# Patient Record
Sex: Female | Born: 1950 | Race: White | Hispanic: No | Marital: Married | Smoking: Never smoker
Health system: Southern US, Community
[De-identification: ages and names within clinical notes are randomized; demographics above are authoritative.]

## PROBLEM LIST (undated history)

## (undated) DIAGNOSIS — G4700 Insomnia, unspecified: Secondary | ICD-10-CM

## (undated) DIAGNOSIS — E0590 Thyrotoxicosis, unspecified without thyrotoxic crisis or storm: Secondary | ICD-10-CM

## (undated) DIAGNOSIS — E7800 Pure hypercholesterolemia, unspecified: Secondary | ICD-10-CM

## (undated) DIAGNOSIS — G43909 Migraine, unspecified, not intractable, without status migrainosus: Secondary | ICD-10-CM

## (undated) DIAGNOSIS — K529 Noninfective gastroenteritis and colitis, unspecified: Secondary | ICD-10-CM

## (undated) DIAGNOSIS — I341 Nonrheumatic mitral (valve) prolapse: Secondary | ICD-10-CM

## (undated) DIAGNOSIS — J45991 Cough variant asthma: Secondary | ICD-10-CM

## (undated) DIAGNOSIS — M1990 Unspecified osteoarthritis, unspecified site: Secondary | ICD-10-CM

## (undated) DIAGNOSIS — Z8489 Family history of other specified conditions: Secondary | ICD-10-CM

## (undated) HISTORY — DX: Insomnia, unspecified: G47.00

## (undated) HISTORY — DX: Noninfective gastroenteritis and colitis, unspecified: K52.9

## (undated) HISTORY — DX: Thyrotoxicosis, unspecified without thyrotoxic crisis or storm: E05.90

## (undated) HISTORY — DX: Nonrheumatic mitral (valve) prolapse: I34.1

## (undated) HISTORY — DX: Cough variant asthma: J45.991

## (undated) HISTORY — DX: Pure hypercholesterolemia, unspecified: E78.00

## (undated) HISTORY — DX: Migraine, unspecified, not intractable, without status migrainosus: G43.909

## (undated) HISTORY — PX: PILONIDAL CYST EXCISION: SHX744

---

## 1997-11-26 ENCOUNTER — Other Ambulatory Visit: Admission: RE | Admit: 1997-11-26 | Discharge: 1997-11-26 | Payer: Self-pay | Admitting: *Deleted

## 1998-06-26 ENCOUNTER — Other Ambulatory Visit: Admission: RE | Admit: 1998-06-26 | Discharge: 1998-06-26 | Payer: Self-pay | Admitting: *Deleted

## 1998-11-27 ENCOUNTER — Other Ambulatory Visit: Admission: RE | Admit: 1998-11-27 | Discharge: 1998-11-27 | Payer: Self-pay | Admitting: *Deleted

## 1999-12-05 ENCOUNTER — Other Ambulatory Visit: Admission: RE | Admit: 1999-12-05 | Discharge: 1999-12-05 | Payer: Self-pay | Admitting: *Deleted

## 2000-05-11 ENCOUNTER — Other Ambulatory Visit: Admission: RE | Admit: 2000-05-11 | Discharge: 2000-05-11 | Payer: Self-pay | Admitting: General Surgery

## 2000-12-06 ENCOUNTER — Other Ambulatory Visit: Admission: RE | Admit: 2000-12-06 | Discharge: 2000-12-06 | Payer: Self-pay | Admitting: Obstetrics & Gynecology

## 2001-01-11 ENCOUNTER — Ambulatory Visit (HOSPITAL_COMMUNITY): Admission: RE | Admit: 2001-01-11 | Discharge: 2001-01-11 | Payer: Self-pay | Admitting: Internal Medicine

## 2001-01-11 ENCOUNTER — Encounter: Payer: Self-pay | Admitting: Internal Medicine

## 2001-11-11 ENCOUNTER — Other Ambulatory Visit: Admission: RE | Admit: 2001-11-11 | Discharge: 2001-11-11 | Payer: Self-pay | Admitting: *Deleted

## 2001-12-16 ENCOUNTER — Ambulatory Visit (HOSPITAL_COMMUNITY): Admission: RE | Admit: 2001-12-16 | Discharge: 2001-12-16 | Payer: Self-pay | Admitting: *Deleted

## 2002-11-16 ENCOUNTER — Other Ambulatory Visit: Admission: RE | Admit: 2002-11-16 | Discharge: 2002-11-16 | Payer: Self-pay | Admitting: *Deleted

## 2003-04-11 ENCOUNTER — Ambulatory Visit (HOSPITAL_COMMUNITY): Admission: RE | Admit: 2003-04-11 | Discharge: 2003-04-11 | Payer: Self-pay | Admitting: Gastroenterology

## 2004-03-12 ENCOUNTER — Ambulatory Visit (HOSPITAL_BASED_OUTPATIENT_CLINIC_OR_DEPARTMENT_OTHER): Admission: RE | Admit: 2004-03-12 | Discharge: 2004-03-12 | Payer: Self-pay | Admitting: Internal Medicine

## 2010-05-28 ENCOUNTER — Other Ambulatory Visit: Payer: Self-pay | Admitting: Internal Medicine

## 2010-05-28 DIAGNOSIS — R7989 Other specified abnormal findings of blood chemistry: Secondary | ICD-10-CM

## 2010-06-04 ENCOUNTER — Ambulatory Visit
Admission: RE | Admit: 2010-06-04 | Discharge: 2010-06-04 | Disposition: A | Discharge status: Home / Self Care | Payer: Managed Care, Other (non HMO) | Source: Ambulatory Visit | Attending: Internal Medicine | Admitting: Internal Medicine

## 2010-06-04 DIAGNOSIS — R7989 Other specified abnormal findings of blood chemistry: Secondary | ICD-10-CM

## 2011-01-08 ENCOUNTER — Encounter: Payer: Self-pay | Admitting: Pulmonary Disease

## 2011-01-13 ENCOUNTER — Encounter: Payer: Self-pay | Admitting: Pulmonary Disease

## 2011-01-14 ENCOUNTER — Encounter: Payer: Self-pay | Admitting: Pulmonary Disease

## 2011-01-14 ENCOUNTER — Ambulatory Visit (INDEPENDENT_AMBULATORY_CARE_PROVIDER_SITE_OTHER): Payer: Managed Care, Other (non HMO) | Admitting: Pulmonary Disease

## 2011-01-14 VITALS — BP 110/70 | HR 74 | Temp 98.0°F | Ht 65.0 in | Wt 134.4 lb

## 2011-01-14 DIAGNOSIS — R05 Cough: Secondary | ICD-10-CM

## 2011-01-14 DIAGNOSIS — R059 Cough, unspecified: Secondary | ICD-10-CM

## 2011-01-14 DIAGNOSIS — R053 Chronic cough: Secondary | ICD-10-CM | POA: Insufficient documentation

## 2011-01-14 NOTE — Patient Instructions (Signed)
Will change pulmicort to qvar  2 inhalations am and pm.  Keep mouth rinsed well. Limit overuse of voice, no throat clearing, use hard candy during day to soothe back of throat to see if helps cough. Please call me in 3 weeks with your response to treatment.  If not better, would consider evaluation with methacholine challenge testing.

## 2011-01-14 NOTE — Progress Notes (Signed)
  Subjective:    Patient ID: Bonnie Dodson, female    DOB: 13-Dec-1950, 60 y.o.   MRN: [id number]  HPI The patient is a 60 year old female who I have been asked to see for persistent cough.  The patient was diagnosed with cough variant asthma approximately 8-9 years ago.  She had noted every time she had a upper respiratory  Infection, she would have a persistent cough afterwards.  She also noticed the cough would worsen with cold air exposure.  She would be treated with a course of prednisone and cough would resolve.  This occurred over multiple occasions, and ultimately she was placed on inhaled corticosteroids.  She felt that she did much better while on this with respect to cold weather exposure and when she developed upper respiratory infections.  She was maintained on Pulmicort one inhalation b.i.d.  In June of this year she returned from a trip to [place], and developed postnasal drip, sore throat, as well as some chest tightness.  She had a worsening dry and hacking cough, and ultimately was treated with a course of prednisone in mid July.  She saw no difference in her cough after the prednisone taper.  She was also treated with a course of antibiotics, but unfortunately during this time developed new upper respiratory tract symptoms.  She has been treated with further doses of prednisone, but continues to have a dry hacking cough.  At its worst, it would increase with prolonged conversation and cold air exposure when opening her refrigerator.  The patient denies postnasal drip or reflux symptoms, and has not been clearing her throat.  She denies a globus sensation.  She has no chronic sinus disease or allergic disease to her knowledge.  She has never had pulmonary  function studies, and has not had a recent chest x-ray.   Review of Systems  Constitutional: Negative for fever and unexpected weight change.  HENT: Negative for ear pain, nosebleeds, congestion, sore throat, rhinorrhea, sneezing,  trouble swallowing, dental problem, postnasal drip and sinus pressure.   Eyes: Negative for redness and itching.  Respiratory: Positive for cough and shortness of breath. Negative for chest tightness and wheezing.   Cardiovascular: Negative for palpitations and leg swelling.  Gastrointestinal: Negative for nausea and vomiting.  Genitourinary: Negative for dysuria.  Musculoskeletal: Negative for joint swelling.  Skin: Negative for rash.  Neurological: Negative for headaches.  Hematological: Does not bruise/bleed easily.  Psychiatric/Behavioral: Negative for dysphoric mood. The patient is not nervous/anxious.        Objective:   Physical Exam Constitutional:  Well developed, no acute distress  HENT:  Nares patent without discharge  Oropharynx without exudate, palate and uvula are normal  Eyes:  Perrla, eomi, no scleral icterus  Neck:  No JVD, no TMG  Cardiovascular:  Normal rate, regular rhythm, no rubs or gallops.  No murmurs        Intact distal pulses  Pulmonary :  Normal breath sounds, no stridor or respiratory distress   No rales, rhonchi, or wheezing  Abdominal:  Soft, nondistended, bowel sounds present.  No tenderness noted.   Musculoskeletal:  No lower extremity edema noted.  Lymph Nodes:  No cervical lymphadenopathy noted  Skin:  No cyanosis noted  Neurologic:  Alert, appropriate, moves all 4 extremities without obvious deficit.         Assessment & Plan:

## 2011-01-14 NOTE — Assessment & Plan Note (Signed)
The patient has a persistent cough despite ongoing treatment for presumed cough variant asthma, as well as prednisone tapers.  Failure to respond to oral prednisone raises the question whether her current cough is really related to asthma.  Some of her history is suggestive of an upper airway cyclical cough.  Her spirometry today is normal.  At this point, I would like to change her inhaler  to Qvar, which has a smaller particles size and less irritating to the upper airway.  Would also like her to try the various behavioral therapies which can help upper airway cyclical coughing.  She denies any postnasal drip or reflux disease currently.  The other question that I have is whether she really has cough variant asthma.  Her response to Pulmicort in the past suggests that she may, although she could also have an upper airway issue which mimics this.  The other possibility is reactive airways disease (not asthma) associated with cold air or other irritants such as upper respiratory tract infections.  I have discussed with her the possibility of doing a methacholine challenge test.  If she continues to have her current issues, I would recommend doing this.

## 2011-02-04 ENCOUNTER — Telehealth: Payer: Self-pay | Admitting: Pulmonary Disease

## 2011-02-04 NOTE — Telephone Encounter (Signed)
[email or web address]

## 2011-02-04 NOTE — Telephone Encounter (Signed)
Pt returned Triage's call & can be reached at [phone].  Bonnie Dodson

## 2011-02-04 NOTE — Telephone Encounter (Signed)
At OV on 01/14/11, KC changed pulmicort to qvar 80 2 puffs in am and pm and instructed pt to call back in 3 wks with an update.  Pt states her symptoms have improved - cough has resolved "so far,"  Denies SOB, wheezing, and chest tightness.  She does not know if this improvement is related to the qvar or not.  She would like to finish the qvar and the go back to the pulmicort.  Requesting KC's thoughts on this.  She is aware he is out of the office until Monday, Oct 29 and is ok with this.  States she thinks she has enough qvar to last until Nov 2 - advised if she will run out prior to Monday, pls call back.  Dr. Shelle Iron, pls advise.  Thanks!

## 2011-02-09 MED ORDER — BECLOMETHASONE DIPROPIONATE 80 MCG/ACT IN AERS: 1.0000 | INHALATION_SPRAY | Freq: Two times a day (BID) | RESPIRATORY_TRACT | Status: DC

## 2011-02-09 NOTE — Telephone Encounter (Signed)
lmtcb

## 2011-02-09 NOTE — Telephone Encounter (Signed)
Pt advised of recs. Appt set for December and refill sent for qvar. Carron Curie, CMA

## 2011-02-09 NOTE — Telephone Encounter (Signed)
Let her know that qvar has a smaller particle size than pulmicort, and therefore is less irritating to the throat and gets into the small airways better.  I would like for her to stay on qvar for another month, and followup with me in December so we can discuss whether to come off all medications and see if cough stays away.  Still unclear whether she has asthma or not, and we can discuss this further at next apptm.  Ok to call in script for qvar.

## 2011-03-16 ENCOUNTER — Ambulatory Visit: Payer: Managed Care, Other (non HMO) | Admitting: Pulmonary Disease

## 2011-04-01 ENCOUNTER — Encounter: Payer: Self-pay | Admitting: Pulmonary Disease

## 2011-04-01 ENCOUNTER — Ambulatory Visit (INDEPENDENT_AMBULATORY_CARE_PROVIDER_SITE_OTHER): Payer: Managed Care, Other (non HMO) | Admitting: Pulmonary Disease

## 2011-04-01 VITALS — BP 136/70 | HR 69 | Temp 97.8°F | Ht 65.0 in | Wt 136.0 lb

## 2011-04-01 DIAGNOSIS — R05 Cough: Secondary | ICD-10-CM

## 2011-04-01 DIAGNOSIS — R053 Chronic cough: Secondary | ICD-10-CM

## 2011-04-01 DIAGNOSIS — R059 Cough, unspecified: Secondary | ICD-10-CM

## 2011-04-01 NOTE — Assessment & Plan Note (Signed)
The patient's history is very suggestive of cough variant asthma, but it is unclear whether this is an accurate diagnosis.  There is no question that she does much better while on inhaled corticosteroids, but some of her history is also suggestive of an upper airway cause for her cough.  Cold air seems to be her primary trigger at this time, and therefore this may be reactive airways disease more so than true asthma.  We have discussed doing a methacholine challenge to put the issue of asthma to rest, but suspect it will not change how we approach her management.  If she has true asthma, I would prefer her being on therapy year-round, but if she has reactive airways disease that is primarily related to cold air, would consider giving her a drug holiday during the summer.  At this point, she and I both agree to continue on the Pulmicort, and try coming off the medication during the summer months.  If she has any escalation of symptoms, would suggest proceeding with further testing.  She will followup with me in one year, but is to call if any change in her symptoms.

## 2011-04-01 NOTE — Patient Instructions (Signed)
Will continue on pulmicort for now, and see how things go.  Would try to come off during the summer months, then restart when the cool air becomes more of an issue.   Will see back in one year, but call if your symptoms are not staying controlled or if you have questions.

## 2011-04-01 NOTE — Progress Notes (Signed)
  Subjective:    Patient ID: Bonnie Dodson, female    DOB: [birth year], 60 y.o.   MRN: [id number]  HPI The patient comes in today for followup of her presumed cough variant asthma.  At the last visit, I tried her on Qvar due to its smaller particles size, but the patient preferred Pulmicort and went back to this.  She has been doing well on the medication, and has not had any issues with her cough or shortness of breath.   Review of Systems  Constitutional: Negative for fever and unexpected weight change.  HENT: Negative for ear pain, nosebleeds, congestion, sore throat, rhinorrhea, sneezing, trouble swallowing, dental problem, postnasal drip and sinus pressure.   Eyes: Negative for redness and itching.  Respiratory: Negative for cough, chest tightness, shortness of breath and wheezing.   Cardiovascular: Negative for palpitations and leg swelling.  Gastrointestinal: Negative for nausea and vomiting.  Genitourinary: Negative for dysuria.  Musculoskeletal: Negative for joint swelling.  Skin: Negative for rash.  Neurological: Negative for headaches.  Hematological: Does not bruise/bleed easily.  Psychiatric/Behavioral: Negative for dysphoric mood. The patient is not nervous/anxious.        Objective:   Physical Exam Well-developed female in no acute distress Nose without purulence or discharge noted Chest clear to auscultation, no wheezes or rhonchi Cardiac exam with regular rate and rhythm Lower extremities without edema, no cyanosis noted Alert and oriented, moves all 4 extremities.       Assessment & Plan:

## 2012-03-31 ENCOUNTER — Ambulatory Visit: Payer: Managed Care, Other (non HMO) | Admitting: Pulmonary Disease

## 2014-04-03 ENCOUNTER — Other Ambulatory Visit (INDEPENDENT_AMBULATORY_CARE_PROVIDER_SITE_OTHER): Payer: Self-pay

## 2014-04-03 ENCOUNTER — Other Ambulatory Visit (INDEPENDENT_AMBULATORY_CARE_PROVIDER_SITE_OTHER): Payer: Self-pay | Admitting: General Surgery

## 2014-04-03 ENCOUNTER — Other Ambulatory Visit: Payer: Self-pay

## 2014-04-03 DIAGNOSIS — R222 Localized swelling, mass and lump, trunk: Secondary | ICD-10-CM

## 2015-08-02 ENCOUNTER — Other Ambulatory Visit: Payer: Self-pay | Admitting: Gastroenterology

## 2015-08-08 ENCOUNTER — Encounter (HOSPITAL_COMMUNITY): Payer: Self-pay | Admitting: *Deleted

## 2015-08-13 ENCOUNTER — Ambulatory Visit (HOSPITAL_COMMUNITY): Payer: Managed Care, Other (non HMO) | Admitting: Anesthesiology

## 2015-08-13 ENCOUNTER — Encounter (HOSPITAL_COMMUNITY): Payer: Self-pay | Admitting: *Deleted

## 2015-08-13 ENCOUNTER — Encounter (HOSPITAL_COMMUNITY)
Admission: RE | Disposition: A | Discharge status: Home / Self Care | Payer: Self-pay | Source: Ambulatory Visit | Attending: Gastroenterology

## 2015-08-13 ENCOUNTER — Ambulatory Visit (HOSPITAL_COMMUNITY)
Admission: RE | Admit: 2015-08-13 | Discharge: 2015-08-13 | Disposition: A | Discharge status: Home / Self Care | Payer: Managed Care, Other (non HMO) | Source: Ambulatory Visit | Attending: Gastroenterology | Admitting: Gastroenterology

## 2015-08-13 DIAGNOSIS — Z1211 Encounter for screening for malignant neoplasm of colon: Secondary | ICD-10-CM | POA: Diagnosis present

## 2015-08-13 DIAGNOSIS — E7800 Pure hypercholesterolemia, unspecified: Secondary | ICD-10-CM | POA: Diagnosis not present

## 2015-08-13 DIAGNOSIS — I341 Nonrheumatic mitral (valve) prolapse: Secondary | ICD-10-CM | POA: Diagnosis not present

## 2015-08-13 HISTORY — DX: Family history of other specified conditions: Z84.89

## 2015-08-13 HISTORY — DX: Unspecified osteoarthritis, unspecified site: M19.90

## 2015-08-13 HISTORY — PX: COLONOSCOPY WITH PROPOFOL: SHX5780

## 2015-08-13 SURGERY — COLONOSCOPY WITH PROPOFOL
Anesthesia: Monitor Anesthesia Care

## 2015-08-13 MED ORDER — PROPOFOL 500 MG/50ML IV EMUL
INTRAVENOUS | Status: DC | PRN
  Administered 2015-08-13 (×2): 30 mg via INTRAVENOUS

## 2015-08-13 MED ORDER — LIDOCAINE HCL (CARDIAC) 20 MG/ML IV SOLN
INTRAVENOUS | Status: DC | PRN
  Administered 2015-08-13: 50 mg via INTRAVENOUS

## 2015-08-13 MED ORDER — PROPOFOL 10 MG/ML IV BOLUS
INTRAVENOUS | Status: AC
  Filled 2015-08-13: qty 40

## 2015-08-13 MED ORDER — SODIUM CHLORIDE 0.9 % IV SOLN: INTRAVENOUS | Status: DC

## 2015-08-13 MED ORDER — METOPROLOL TARTRATE 5 MG/5ML IV SOLN
INTRAVENOUS | Status: AC
  Filled 2015-08-13: qty 5

## 2015-08-13 MED ORDER — LABETALOL HCL 5 MG/ML IV SOLN
INTRAVENOUS | Status: AC
  Filled 2015-08-13: qty 4

## 2015-08-13 MED ORDER — PROPOFOL 500 MG/50ML IV EMUL
INTRAVENOUS | Status: DC | PRN
  Administered 2015-08-13: 100 ug/kg/min via INTRAVENOUS

## 2015-08-13 MED ORDER — LACTATED RINGERS IV SOLN
INTRAVENOUS | Status: DC
  Administered 2015-08-13: 09:00:00 via INTRAVENOUS
  Administered 2015-08-13: 1000 mL via INTRAVENOUS

## 2015-08-13 SURGICAL SUPPLY — 22 items

## 2015-08-13 NOTE — Transfer of Care (Signed)
Immediate Anesthesia Transfer of Care Note  Patient: Bonnie Dodson  Procedure(s) Performed: Procedure(s): COLONOSCOPY WITH PROPOFOL (N/A)  Patient Location: PACU  Anesthesia Type:MAC  Level of Consciousness: awake, alert  and oriented  Airway & Oxygen Therapy: Patient Spontanous Breathing and Patient connected to face mask oxygen  Post-op Assessment: Report given to RN and Post -op Vital signs reviewed and stable  Post vital signs: Reviewed and stable  Last Vitals:  Filed Vitals:   08/13/15 0826  BP: 140/58  Pulse: 60  Temp: 36.7 C  Resp: 12    Last Pain: There were no vitals filed for this visit.       Complications: No apparent anesthesia complications

## 2015-08-13 NOTE — Anesthesia Preprocedure Evaluation (Signed)
Anesthesia Evaluation  Patient identified by MRN, date of birth, ID band Patient awake    Reviewed: Allergy & Precautions, NPO status , Patient's Chart, lab work & pertinent test results  History of Anesthesia Complications (+) Family history of anesthesia reaction  Airway Mallampati: II  TM Distance: >3 FB Neck ROM: Full    Dental  (+) Teeth Intact   Pulmonary asthma ,    breath sounds clear to auscultation       Cardiovascular negative cardio ROS  + Valvular Problems/Murmurs MVP  Rhythm:Regular Rate:Normal + Systolic murmurs    Neuro/Psych  Headaches, negative psych ROS   GI/Hepatic negative GI ROS, Neg liver ROS,   Endo/Other  Hyperthyroidism   Renal/GU negative Renal ROS  negative genitourinary   Musculoskeletal  (+) Arthritis ,   Abdominal   Peds negative pediatric ROS (+)  Hematology negative hematology ROS (+)   Anesthesia Other Findings   Reproductive/Obstetrics negative OB ROS                             Anesthesia Physical Anesthesia Plan  ASA: II  Anesthesia Plan: MAC   Post-op Pain Management:    Induction: Intravenous  Airway Management Planned: Simple Face Mask  Additional Equipment:   Intra-op Plan:   Post-operative Plan:   Informed Consent: I have reviewed the patients History and Physical, chart, labs and discussed the procedure including the risks, benefits and alternatives for the proposed anesthesia with the patient or authorized representative who has indicated his/her understanding and acceptance.     Plan Discussed with: CRNA  Anesthesia Plan Comments:         Anesthesia Quick Evaluation

## 2015-08-13 NOTE — H&P (Signed)
  Procedure: Screening colonoscopy. Normal screening colonoscopy performed on 04/11/2003  History: The patient is a 65 year old female born [birth year]. She is scheduled to undergo a repeat screening colonoscopy today.  Medication allergies: None  Family history: Negative for colorectal cancer  Past medical history: Mitral valve prolapse syndrome. Cough variant asthma. Hyperthyroidism. Hypercholesterolemia. Migraine headache syndrome. Pilonidal cyst surgery. D&C. Laser surgery to the cervix.  Exam: The patient is alert and lying comfortably on the endoscopy stretcher. Abdomen is soft and nontender to palpation. Lungs are clear to auscultation. Cardiac exam reveals a regular rhythm.  Plan: Proceed with screening colonoscopy

## 2015-08-13 NOTE — Anesthesia Postprocedure Evaluation (Signed)
Anesthesia Post Note  Patient: Bonnie Dodson  Procedure(s) Performed: Procedure(s) (LRB): COLONOSCOPY WITH PROPOFOL (N/A)  Patient location during evaluation: Endoscopy Anesthesia Type: MAC Level of consciousness: awake and alert Pain management: pain level controlled Vital Signs Assessment: post-procedure vital signs reviewed and stable Respiratory status: spontaneous breathing, nonlabored ventilation, respiratory function stable and patient connected to nasal cannula oxygen Cardiovascular status: stable and blood pressure returned to baseline Anesthetic complications: no    Last Vitals:  Filed Vitals:   08/13/15 1000 08/13/15 1005  BP: 121/58 117/60  Pulse: 70 68  Temp:    Resp: 13 14    Last Pain: There were no vitals filed for this visit.               Effie Berkshire

## 2015-08-13 NOTE — Discharge Instructions (Signed)

## 2015-08-13 NOTE — Op Note (Signed)
[facility] Patient Name: Bonnie Dodson Procedure Date: 08/13/2015 MRN: [id number] Attending MD: Garlan Fair , MD Date of Birth: September 28, 1950 CSN:  Age: 65 Admit Type: Outpatient Procedure:                Colonoscopy Indications:              Screening for colorectal malignant neoplasm. Normal                            screening colonsocopy was performed on 04/11/2003. Providers:                Garlan Fair, MD, Laverta Baltimore, RN, Cletis Athens, Technician Referring MD:              Medicines:                Propofol per Anesthesia Complications:            No immediate complications. Estimated Blood Loss:     Estimated blood loss: none. Procedure:                Pre-Anesthesia Assessment:                           - Prior to the procedure, a History and Physical                            was performed, and patient medications and                            allergies were reviewed. The patient's tolerance of                            previous anesthesia was also reviewed. The risks                            and benefits of the procedure and the sedation                            options and risks were discussed with the patient.                            All questions were answered, and informed consent                            was obtained. Prior Anticoagulants: The patient has                            taken no previous anticoagulant or antiplatelet                            agents. ASA Grade Assessment: II - A patient with  mild systemic disease. After reviewing the risks                            and benefits, the patient was deemed in                            satisfactory condition to undergo the procedure.                           After obtaining informed consent, the colonoscope                            was passed under direct vision. Throughout the   procedure, the patient's blood pressure, pulse, and                            oxygen saturations were monitored continuously. The                            was introduced through the anus and advanced to the                            the cecum, identified by appendiceal orifice and                            ileocecal valve. The colonoscopy was performed                            without difficulty. The patient tolerated the                            procedure well. The quality of the bowel                            preparation was good. The terminal ileum, the                            ileocecal valve, the appendiceal orifice and the                            rectum were photographed. Scope In: 9:27:16 AM Scope Out: 9:44:12 AM Scope Withdrawal Time: 0 hours 9 minutes 28 seconds  Total Procedure Duration: 0 hours 16 minutes 56 seconds  Findings:      The perianal and digital rectal examinations were normal.      The entire examined colon appeared normal. Left colonic diverticulosis       was present. Impression:               - The entire examined colon is normal.                           - No specimens collected. Moderate Sedation:      N/A- Per Anesthesia Care Recommendation:           - Patient has a contact number available for  emergencies. The signs and symptoms of potential                            delayed complications were discussed with the                            patient. Return to normal activities tomorrow.                            Written discharge instructions were provided to the                            patient.                           - Repeat colonoscopy in 10 years for screening                            purposes.                           - Resume previous diet.                           - Continue present medications. Procedure Code(s):        --- Professional ---                           [id number], Colonoscopy,  flexible; diagnostic, including                            collection of specimen(s) by brushing or washing,                            when performed (separate procedure) Diagnosis Code(s):        --- Professional ---                           Z12.11, Encounter for screening for malignant                            neoplasm of colon CPT copyright 2016 [employer]. All rights reserved. The codes documented in this report are preliminary and upon coder review may  be revised to meet current compliance requirements. Earle Gell, MD Garlan Fair, MD 08/13/2015 9:50:24 AM This report has been signed electronically. Number of Addenda: 0

## 2015-08-14 ENCOUNTER — Encounter (HOSPITAL_COMMUNITY): Payer: Self-pay | Admitting: Gastroenterology

## 2016-04-30 ENCOUNTER — Emergency Department (HOSPITAL_COMMUNITY)
Admission: EM | Admit: 2016-04-30 | Discharge: 2016-04-30 | Disposition: A | Discharge status: Home / Self Care | Payer: Managed Care, Other (non HMO) | Attending: Emergency Medicine | Admitting: Emergency Medicine

## 2016-04-30 ENCOUNTER — Encounter (HOSPITAL_COMMUNITY): Payer: Self-pay | Admitting: Emergency Medicine

## 2016-04-30 ENCOUNTER — Emergency Department (HOSPITAL_COMMUNITY): Payer: Managed Care, Other (non HMO)

## 2016-04-30 DIAGNOSIS — S0992XA Unspecified injury of nose, initial encounter: Secondary | ICD-10-CM | POA: Diagnosis present

## 2016-04-30 DIAGNOSIS — Y93H1 Activity, digging, shoveling and raking: Secondary | ICD-10-CM | POA: Diagnosis not present

## 2016-04-30 DIAGNOSIS — S0993XA Unspecified injury of face, initial encounter: Secondary | ICD-10-CM

## 2016-04-30 DIAGNOSIS — Z79899 Other long term (current) drug therapy: Secondary | ICD-10-CM | POA: Diagnosis not present

## 2016-04-30 DIAGNOSIS — W228XXA Striking against or struck by other objects, initial encounter: Secondary | ICD-10-CM | POA: Insufficient documentation

## 2016-04-30 DIAGNOSIS — Y929 Unspecified place or not applicable: Secondary | ICD-10-CM | POA: Diagnosis not present

## 2016-04-30 DIAGNOSIS — Y999 Unspecified external cause status: Secondary | ICD-10-CM | POA: Insufficient documentation

## 2016-04-30 DIAGNOSIS — R040 Epistaxis: Secondary | ICD-10-CM | POA: Insufficient documentation

## 2016-04-30 MED ORDER — OXYMETAZOLINE HCL 0.05 % NA SOLN
1.0000 | Freq: Two times a day (BID) | NASAL | Status: DC
  Administered 2016-04-30: 1 via NASAL
  Filled 2016-04-30: qty 15

## 2016-04-30 NOTE — ED Triage Notes (Addendum)
Pt stated  That she  Was shoveling snow one hour ago and the handle of the shovel popped up and struck her upper lip and  her in the nose. Nosebleed, slow-bleeding controlled with ice pack. Husband at bedside. Pt denies dizziness, denies LOC

## 2016-04-30 NOTE — ED Triage Notes (Addendum)
Pt is requesting to know the cost of the CT before agreeing to it. CT notified

## 2016-04-30 NOTE — ED Provider Notes (Signed)
[facility] DEPT Provider Note   CSN: [id number] Arrival date & time: 04/30/16  1237  History   Chief Complaint Chief Complaint  Patient presents with  . Facial Injury  . Epistaxis    HPI Bonnie Dodson is a 66 y.o. female.  HPI  Patient has a PMH of arthritis, Migraines, mitral valve prolapse presents to the emergency department brought in by her husband for evaluation of injury to her lip and her nose. The patient was outside shoveling snow when the shovel handle accidentally popped up and hit her in the face. She had immediately has bilateral epistaxis. She continues to bleed from bilateral nare, slow trickling. She does have pain to her nasal bridge. She denies any severe pain or injury to her teeth. Denies oral pain, lock, neck pain. Denies falling or any other injuries. Husband brought her immediately in.  Past Medical History:  Diagnosis Date  . Arthritis    generalized  arthritiis hips  . Colitis    5 yrs ago- no reoccurrence.  . Cough variant asthma   . Family history of adverse reaction to anesthesia    "maternal grandmother nausea and vomiting"  . Hypercholesterolemia   . Hyperthyroidism   . Insomnia   . Migraines    occ.   Marland Kitchen MVP (mitral valve prolapse)    "has click" - all hes knows- never any other symptoms    Patient Active Problem List   Diagnosis Date Noted  . Chronic cough 01/14/2011    Past Surgical History:  Procedure Laterality Date  . COLONOSCOPY WITH PROPOFOL N/A 08/13/2015   Procedure: COLONOSCOPY WITH PROPOFOL;  Surgeon: Garlan Fair, MD;  Location: [facility] ENDOSCOPY;  Service: Endoscopy;  Laterality: N/A;  . PILONIDAL CYST EXCISION     age 45's    OB History    No data available       Home Medications    Prior to Admission medications   Medication Sig Start Date End Date Taking? Authorizing Provider  budesonide (PULMICORT FLEXHALER) 180 MCG/ACT inhaler Inhale 1 puff into the lungs 2 (two) times daily.      Historical Provider, MD   Calcium Carbonate-Vitamin D (CALCIUM 600+D) 600-400 MG-UNIT per tablet Take 1 tablet by mouth daily.      Historical Provider, MD  fish oil-omega-3 fatty acids 1000 MG capsule Take 1 capsule by mouth 2 (two) times daily.      Historical Provider, MD  methimazole (TAPAZOLE) 10 MG tablet Take 10 mg by mouth daily.     Historical Provider, MD  Misc Natural Products (GLUCOSAMINE CHONDROITIN ADV PO) Take 1 tablet by mouth daily.     Historical Provider, MD  Multiple Vitamin (MULTIVITAMIN) capsule Take 1 capsule by mouth daily.      Historical Provider, MD  Multiple Vitamins-Minerals (PRESERVISION AREDS PO) Take 1 tablet by mouth 2 (two) times daily.    Historical Provider, MD  Polyethyl Glycol-Propyl Glycol (SYSTANE ULTRA OP) Apply 1 drop to eye 4 (four) times daily.    Historical Provider, MD  ZOLMitriptan (ZOMIG) 2.5 MG tablet Take 2.5 mg by mouth as needed for migraine or headache.     Historical Provider, MD    Family History Family History  Problem Relation Age of Onset  . Emphysema Paternal Grandmother   . Allergies Paternal Grandmother   . Asthma Paternal Grandmother   . Heart disease Paternal Grandmother   . Allergies Father   . Asthma Father   . Heart disease Father   . Allergies  Brother   . Asthma Brother     Social History Social History  Substance Use Topics  . Smoking status: Never Smoker  . Smokeless tobacco: Never Used  . Alcohol use Yes     Comment: soical -rare     Allergies   Patient has no known allergies.   Review of Systems Review of Systems Review of Systems All other systems negative except as documented in the HPI. All pertinent positives and negatives as reviewed in the HPI.   Physical Exam Updated Vital Signs Pulse 97   Temp 98.1 F (36.7 C) (Oral)   Resp 18   Wt 59.9 kg   SpO2 100%   BMI 21.97 kg/m   Physical Exam  Constitutional: She appears well-developed and well-nourished. No distress.  HENT:  Head: Normocephalic and atraumatic.    Nose: No nose lacerations or nasal deformity.  Swelling to nasal bridge, blood coming from bilateral nare and small amount of blood noted to back of throat. No septal deviation. + tenderness. No sinus tenderness. EOMIS intact. Mild swelling to mid upper lip. No intra oral injury. FROM of bilateral jaw.  Eyes: Pupils are equal, round, and reactive to light.  Neck: Normal range of motion. Neck supple.  Cardiovascular: Normal rate and regular rhythm.   Pulmonary/Chest: Effort normal.  Abdominal: Soft.  Neurological: She is alert.  Skin: Skin is warm and dry.  Nursing note and vitals reviewed.    ED Treatments / Results  Labs (all labs ordered are listed, but only abnormal results are displayed) Labs Reviewed - No data to display  EKG  EKG Interpretation None       Radiology Dg Nasal Bones  Result Date: 04/30/2016 CLINICAL DATA:  Blow to the nose while shoveling snow today with onset of pain. Initial encounter. EXAM: NASAL BONES - 3+ VIEW COMPARISON:  None. FINDINGS: There is no evidence of fracture or other bone abnormality. IMPRESSION: Negative exam. Electronically Signed   By: Inge Rise M.D.   On: 04/30/2016 15:19    Procedures Procedures (including critical care time)  Medications Ordered in ED Medications  oxymetazoline (AFRIN) 0.05 % nasal spray 1 spray (1 spray Each Nare Given 04/30/16 1419)     Initial Impression / Assessment and Plan / ED Course  I have reviewed the triage vital signs and the nursing notes.  Pertinent labs & imaging results that were available during my care of the patient were reviewed by me and considered in my medical decision making (see chart for details).     2:06 pm- will obtain CT MFac, asking patient to apply pressure to nasal bridge and will give Afrin intranasally.  3:27 pm- pt refuses CT due to cost, xray obtained and unremarkable. bleeding stopped with Afrin and pressure, advised not to blow nose. Pt education and return  precautions given.  Final Clinical Impressions(s) / ED Diagnoses   Final diagnoses:  Nasal injury, initial encounter  Epistaxis  Facial injury, initial encounter    New Prescriptions New Prescriptions   No medications on file     Delos Haring, PA-C 04/30/16 [place], MD 05/04/16 [phone]

## 2016-10-12 DIAGNOSIS — H5203 Hypermetropia, bilateral: Secondary | ICD-10-CM | POA: Diagnosis not present

## 2016-10-12 DIAGNOSIS — H1859 Other hereditary corneal dystrophies: Secondary | ICD-10-CM | POA: Diagnosis not present

## 2016-10-12 DIAGNOSIS — H353132 Nonexudative age-related macular degeneration, bilateral, intermediate dry stage: Secondary | ICD-10-CM | POA: Diagnosis not present

## 2016-10-12 DIAGNOSIS — H2513 Age-related nuclear cataract, bilateral: Secondary | ICD-10-CM | POA: Diagnosis not present

## 2016-11-02 DIAGNOSIS — E0590 Thyrotoxicosis, unspecified without thyrotoxic crisis or storm: Secondary | ICD-10-CM | POA: Diagnosis not present

## 2016-11-12 DIAGNOSIS — Z0100 Encounter for examination of eyes and vision without abnormal findings: Secondary | ICD-10-CM | POA: Diagnosis not present

## 2016-12-21 DIAGNOSIS — E0590 Thyrotoxicosis, unspecified without thyrotoxic crisis or storm: Secondary | ICD-10-CM | POA: Diagnosis not present

## 2017-01-12 DIAGNOSIS — R748 Abnormal levels of other serum enzymes: Secondary | ICD-10-CM | POA: Diagnosis not present

## 2017-01-12 DIAGNOSIS — E0500 Thyrotoxicosis with diffuse goiter without thyrotoxic crisis or storm: Secondary | ICD-10-CM | POA: Diagnosis not present

## 2017-01-12 DIAGNOSIS — E0590 Thyrotoxicosis, unspecified without thyrotoxic crisis or storm: Secondary | ICD-10-CM | POA: Diagnosis not present

## 2017-01-12 DIAGNOSIS — Z5181 Encounter for therapeutic drug level monitoring: Secondary | ICD-10-CM | POA: Diagnosis not present

## 2017-01-12 DIAGNOSIS — Z8349 Family history of other endocrine, nutritional and metabolic diseases: Secondary | ICD-10-CM | POA: Diagnosis not present

## 2017-02-01 DIAGNOSIS — R69 Illness, unspecified: Secondary | ICD-10-CM | POA: Diagnosis not present

## 2017-02-12 DIAGNOSIS — E0590 Thyrotoxicosis, unspecified without thyrotoxic crisis or storm: Secondary | ICD-10-CM | POA: Diagnosis not present

## 2017-03-01 DIAGNOSIS — Z01419 Encounter for gynecological examination (general) (routine) without abnormal findings: Secondary | ICD-10-CM | POA: Diagnosis not present

## 2017-03-01 DIAGNOSIS — Z9189 Other specified personal risk factors, not elsewhere classified: Secondary | ICD-10-CM | POA: Diagnosis not present

## 2017-03-01 DIAGNOSIS — Z23 Encounter for immunization: Secondary | ICD-10-CM | POA: Diagnosis not present

## 2017-03-01 DIAGNOSIS — Z1231 Encounter for screening mammogram for malignant neoplasm of breast: Secondary | ICD-10-CM | POA: Diagnosis not present

## 2017-03-09 DIAGNOSIS — E0590 Thyrotoxicosis, unspecified without thyrotoxic crisis or storm: Secondary | ICD-10-CM | POA: Diagnosis not present

## 2017-03-16 DIAGNOSIS — R3129 Other microscopic hematuria: Secondary | ICD-10-CM | POA: Diagnosis not present

## 2017-03-26 DIAGNOSIS — Z5181 Encounter for therapeutic drug level monitoring: Secondary | ICD-10-CM | POA: Diagnosis not present

## 2017-03-26 DIAGNOSIS — E0500 Thyrotoxicosis with diffuse goiter without thyrotoxic crisis or storm: Secondary | ICD-10-CM | POA: Diagnosis not present

## 2017-03-26 DIAGNOSIS — E0590 Thyrotoxicosis, unspecified without thyrotoxic crisis or storm: Secondary | ICD-10-CM | POA: Diagnosis not present

## 2017-03-26 DIAGNOSIS — R748 Abnormal levels of other serum enzymes: Secondary | ICD-10-CM | POA: Diagnosis not present

## 2017-03-26 DIAGNOSIS — Z8349 Family history of other endocrine, nutritional and metabolic diseases: Secondary | ICD-10-CM | POA: Diagnosis not present

## 2017-04-16 DIAGNOSIS — E0590 Thyrotoxicosis, unspecified without thyrotoxic crisis or storm: Secondary | ICD-10-CM | POA: Diagnosis not present

## 2017-04-29 DIAGNOSIS — Z0101 Encounter for examination of eyes and vision with abnormal findings: Secondary | ICD-10-CM | POA: Diagnosis not present

## 2017-05-05 IMAGING — CR DG NASAL BONES 3+V
4 series · 4 of 4 positions shown · non-contrast
Comparison: None.

CLINICAL DATA: Blow to the nose while shoveling snow today with
onset of pain. Initial encounter.

EXAM:
NASAL BONES - 3+ VIEW

[w waters pa (1 of 2)]
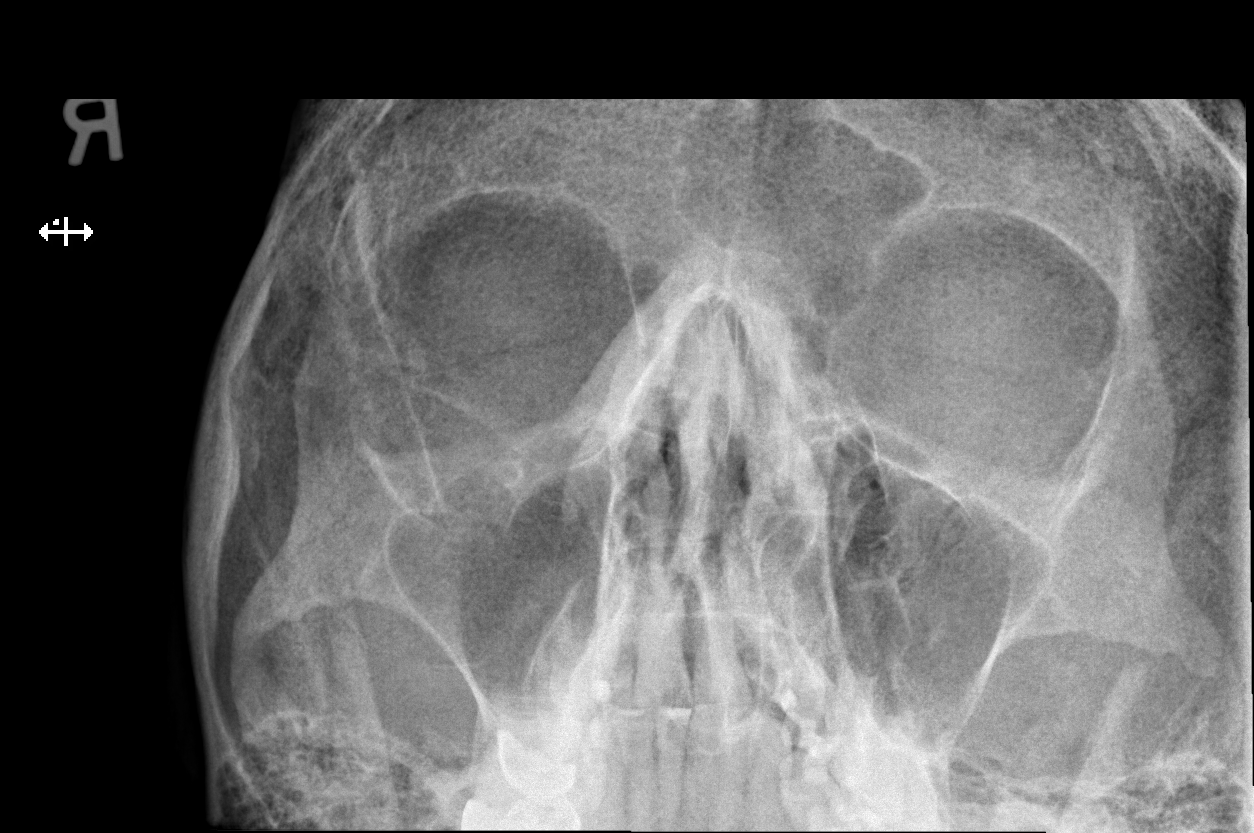

[w waters pa (2 of 2)]
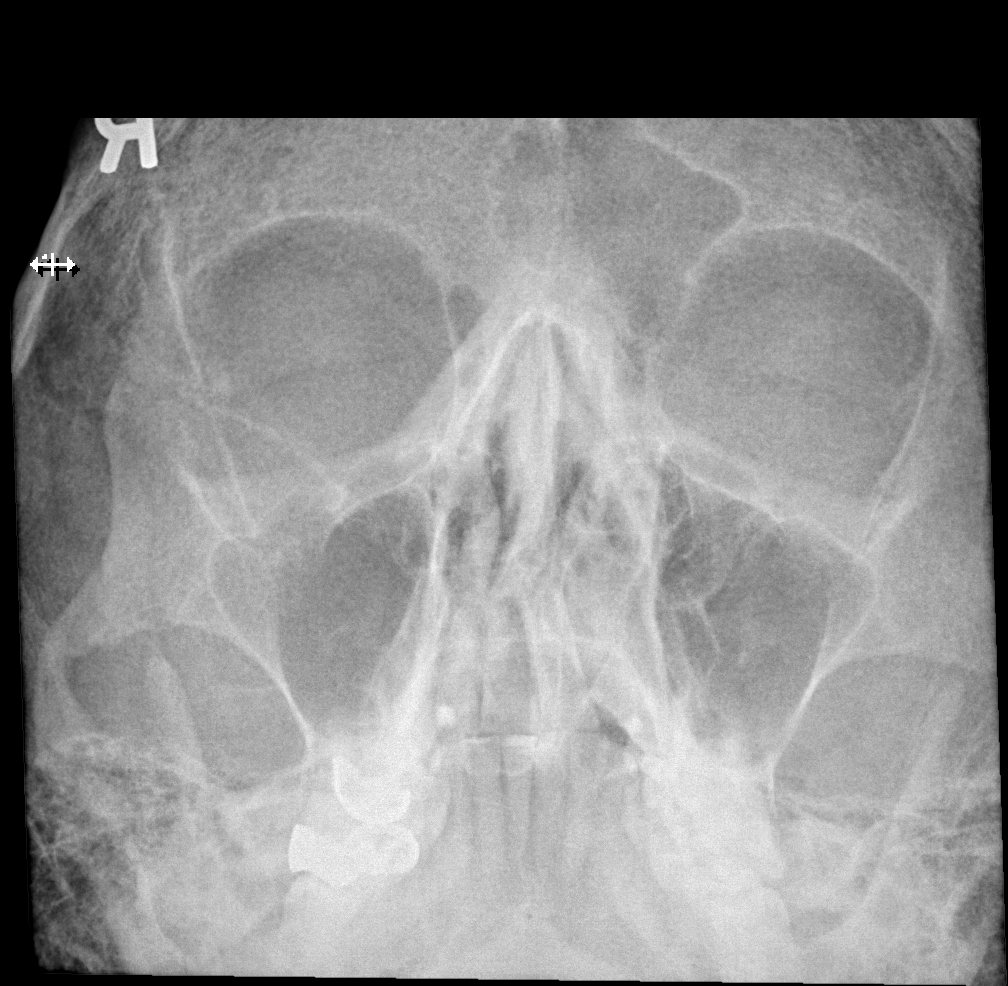

[w nasal bone lat (1 of 2)]
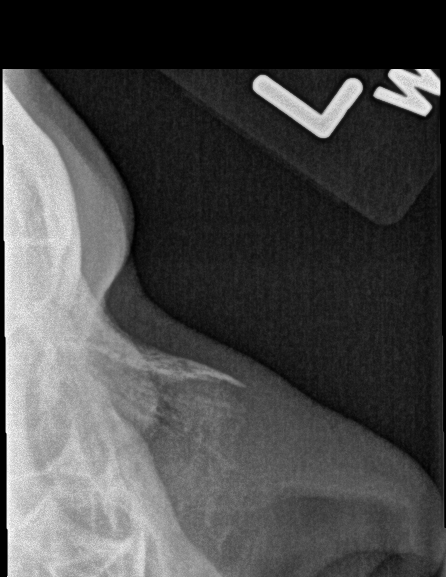

[w nasal bone lat (2 of 2)]
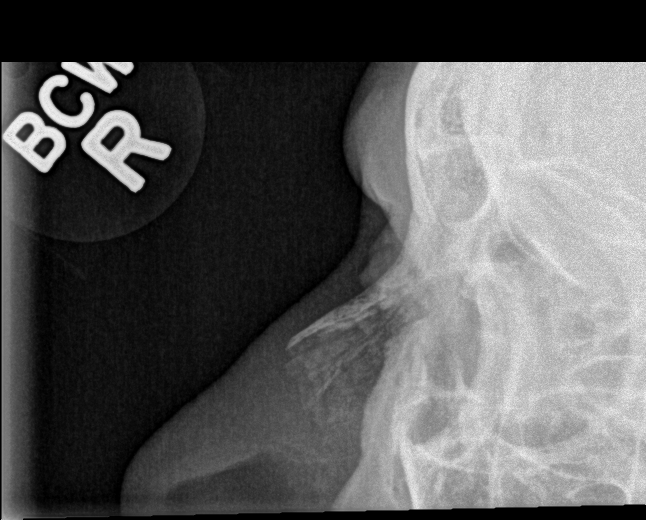

[4 of 4 positions shown; findings below may reference images not displayed]

FINDINGS: There is no evidence of fracture or other bone abnormality.
IMPRESSION: Negative exam.

## 2017-05-24 DIAGNOSIS — E0590 Thyrotoxicosis, unspecified without thyrotoxic crisis or storm: Secondary | ICD-10-CM | POA: Diagnosis not present

## 2017-06-02 DIAGNOSIS — H8123 Vestibular neuronitis, bilateral: Secondary | ICD-10-CM | POA: Diagnosis not present

## 2017-06-02 DIAGNOSIS — R110 Nausea: Secondary | ICD-10-CM | POA: Diagnosis not present

## 2017-06-19 DIAGNOSIS — J209 Acute bronchitis, unspecified: Secondary | ICD-10-CM | POA: Diagnosis not present

## 2017-06-23 DIAGNOSIS — D2272 Melanocytic nevi of left lower limb, including hip: Secondary | ICD-10-CM | POA: Diagnosis not present

## 2017-06-23 DIAGNOSIS — D2271 Melanocytic nevi of right lower limb, including hip: Secondary | ICD-10-CM | POA: Diagnosis not present

## 2017-06-23 DIAGNOSIS — L723 Sebaceous cyst: Secondary | ICD-10-CM | POA: Diagnosis not present

## 2017-06-23 DIAGNOSIS — L570 Actinic keratosis: Secondary | ICD-10-CM | POA: Diagnosis not present

## 2017-06-23 DIAGNOSIS — D225 Melanocytic nevi of trunk: Secondary | ICD-10-CM | POA: Diagnosis not present

## 2017-06-23 DIAGNOSIS — L603 Nail dystrophy: Secondary | ICD-10-CM | POA: Diagnosis not present

## 2017-06-23 DIAGNOSIS — Z808 Family history of malignant neoplasm of other organs or systems: Secondary | ICD-10-CM | POA: Diagnosis not present

## 2017-06-25 DIAGNOSIS — E0590 Thyrotoxicosis, unspecified without thyrotoxic crisis or storm: Secondary | ICD-10-CM | POA: Diagnosis not present

## 2017-06-25 DIAGNOSIS — E7800 Pure hypercholesterolemia, unspecified: Secondary | ICD-10-CM | POA: Diagnosis not present

## 2017-06-25 DIAGNOSIS — Z79899 Other long term (current) drug therapy: Secondary | ICD-10-CM | POA: Diagnosis not present

## 2017-06-25 DIAGNOSIS — R748 Abnormal levels of other serum enzymes: Secondary | ICD-10-CM | POA: Diagnosis not present

## 2017-07-06 DIAGNOSIS — G43009 Migraine without aura, not intractable, without status migrainosus: Secondary | ICD-10-CM | POA: Diagnosis not present

## 2017-07-06 DIAGNOSIS — J4520 Mild intermittent asthma, uncomplicated: Secondary | ICD-10-CM | POA: Diagnosis not present

## 2017-07-06 DIAGNOSIS — E7800 Pure hypercholesterolemia, unspecified: Secondary | ICD-10-CM | POA: Diagnosis not present

## 2017-07-06 DIAGNOSIS — E0590 Thyrotoxicosis, unspecified without thyrotoxic crisis or storm: Secondary | ICD-10-CM | POA: Diagnosis not present

## 2017-07-06 DIAGNOSIS — Z23 Encounter for immunization: Secondary | ICD-10-CM | POA: Diagnosis not present

## 2017-07-06 DIAGNOSIS — Z1389 Encounter for screening for other disorder: Secondary | ICD-10-CM | POA: Diagnosis not present

## 2017-07-06 DIAGNOSIS — Z0000 Encounter for general adult medical examination without abnormal findings: Secondary | ICD-10-CM | POA: Diagnosis not present

## 2017-08-05 DIAGNOSIS — R69 Illness, unspecified: Secondary | ICD-10-CM | POA: Diagnosis not present

## 2017-09-14 DIAGNOSIS — Z5181 Encounter for therapeutic drug level monitoring: Secondary | ICD-10-CM | POA: Diagnosis not present

## 2017-09-14 DIAGNOSIS — E0500 Thyrotoxicosis with diffuse goiter without thyrotoxic crisis or storm: Secondary | ICD-10-CM | POA: Diagnosis not present

## 2017-09-14 DIAGNOSIS — E0590 Thyrotoxicosis, unspecified without thyrotoxic crisis or storm: Secondary | ICD-10-CM | POA: Diagnosis not present

## 2017-09-14 DIAGNOSIS — R748 Abnormal levels of other serum enzymes: Secondary | ICD-10-CM | POA: Diagnosis not present

## 2017-09-14 DIAGNOSIS — Z8349 Family history of other endocrine, nutritional and metabolic diseases: Secondary | ICD-10-CM | POA: Diagnosis not present

## 2017-10-25 DIAGNOSIS — H353132 Nonexudative age-related macular degeneration, bilateral, intermediate dry stage: Secondary | ICD-10-CM | POA: Diagnosis not present

## 2017-10-25 DIAGNOSIS — H1859 Other hereditary corneal dystrophies: Secondary | ICD-10-CM | POA: Diagnosis not present

## 2017-10-25 DIAGNOSIS — H2513 Age-related nuclear cataract, bilateral: Secondary | ICD-10-CM | POA: Diagnosis not present

## 2017-10-25 DIAGNOSIS — H524 Presbyopia: Secondary | ICD-10-CM | POA: Diagnosis not present

## 2017-12-27 DIAGNOSIS — Z5181 Encounter for therapeutic drug level monitoring: Secondary | ICD-10-CM | POA: Diagnosis not present

## 2017-12-27 DIAGNOSIS — R748 Abnormal levels of other serum enzymes: Secondary | ICD-10-CM | POA: Diagnosis not present

## 2017-12-27 DIAGNOSIS — E0590 Thyrotoxicosis, unspecified without thyrotoxic crisis or storm: Secondary | ICD-10-CM | POA: Diagnosis not present

## 2018-02-07 DIAGNOSIS — R69 Illness, unspecified: Secondary | ICD-10-CM | POA: Diagnosis not present

## 2018-03-01 DIAGNOSIS — G43909 Migraine, unspecified, not intractable, without status migrainosus: Secondary | ICD-10-CM | POA: Diagnosis not present

## 2018-03-01 DIAGNOSIS — Z8249 Family history of ischemic heart disease and other diseases of the circulatory system: Secondary | ICD-10-CM | POA: Diagnosis not present

## 2018-03-01 DIAGNOSIS — Z833 Family history of diabetes mellitus: Secondary | ICD-10-CM | POA: Diagnosis not present

## 2018-03-01 DIAGNOSIS — E039 Hypothyroidism, unspecified: Secondary | ICD-10-CM | POA: Diagnosis not present

## 2018-03-01 DIAGNOSIS — Z823 Family history of stroke: Secondary | ICD-10-CM | POA: Diagnosis not present

## 2018-03-01 DIAGNOSIS — Z809 Family history of malignant neoplasm, unspecified: Secondary | ICD-10-CM | POA: Diagnosis not present

## 2018-03-01 DIAGNOSIS — J45909 Unspecified asthma, uncomplicated: Secondary | ICD-10-CM | POA: Diagnosis not present

## 2018-03-01 DIAGNOSIS — Z7951 Long term (current) use of inhaled steroids: Secondary | ICD-10-CM | POA: Diagnosis not present

## 2018-03-03 DIAGNOSIS — Z23 Encounter for immunization: Secondary | ICD-10-CM | POA: Diagnosis not present

## 2018-03-03 DIAGNOSIS — Z9189 Other specified personal risk factors, not elsewhere classified: Secondary | ICD-10-CM | POA: Diagnosis not present

## 2018-03-03 DIAGNOSIS — Z779 Other contact with and (suspected) exposures hazardous to health: Secondary | ICD-10-CM | POA: Diagnosis not present

## 2018-03-03 DIAGNOSIS — Z1231 Encounter for screening mammogram for malignant neoplasm of breast: Secondary | ICD-10-CM | POA: Diagnosis not present

## 2018-03-17 DIAGNOSIS — Z79899 Other long term (current) drug therapy: Secondary | ICD-10-CM | POA: Diagnosis not present

## 2018-03-17 DIAGNOSIS — E0590 Thyrotoxicosis, unspecified without thyrotoxic crisis or storm: Secondary | ICD-10-CM | POA: Diagnosis not present

## 2018-03-17 DIAGNOSIS — Z8349 Family history of other endocrine, nutritional and metabolic diseases: Secondary | ICD-10-CM | POA: Diagnosis not present

## 2018-03-17 DIAGNOSIS — R748 Abnormal levels of other serum enzymes: Secondary | ICD-10-CM | POA: Diagnosis not present

## 2018-03-17 DIAGNOSIS — E0500 Thyrotoxicosis with diffuse goiter without thyrotoxic crisis or storm: Secondary | ICD-10-CM | POA: Diagnosis not present

## 2018-06-23 DIAGNOSIS — Z808 Family history of malignant neoplasm of other organs or systems: Secondary | ICD-10-CM | POA: Diagnosis not present

## 2018-06-23 DIAGNOSIS — D2272 Melanocytic nevi of left lower limb, including hip: Secondary | ICD-10-CM | POA: Diagnosis not present

## 2018-06-23 DIAGNOSIS — L817 Pigmented purpuric dermatosis: Secondary | ICD-10-CM | POA: Diagnosis not present

## 2018-06-23 DIAGNOSIS — D225 Melanocytic nevi of trunk: Secondary | ICD-10-CM | POA: Diagnosis not present

## 2018-06-23 DIAGNOSIS — D2271 Melanocytic nevi of right lower limb, including hip: Secondary | ICD-10-CM | POA: Diagnosis not present

## 2018-06-23 DIAGNOSIS — Z23 Encounter for immunization: Secondary | ICD-10-CM | POA: Diagnosis not present

## 2018-09-14 DIAGNOSIS — R69 Illness, unspecified: Secondary | ICD-10-CM | POA: Diagnosis not present

## 2018-09-22 ENCOUNTER — Other Ambulatory Visit: Payer: Self-pay | Admitting: Internal Medicine

## 2018-09-22 DIAGNOSIS — E0500 Thyrotoxicosis with diffuse goiter without thyrotoxic crisis or storm: Secondary | ICD-10-CM | POA: Diagnosis not present

## 2018-09-22 DIAGNOSIS — M8580 Other specified disorders of bone density and structure, unspecified site: Secondary | ICD-10-CM

## 2018-09-22 DIAGNOSIS — J4520 Mild intermittent asthma, uncomplicated: Secondary | ICD-10-CM | POA: Diagnosis not present

## 2018-09-22 DIAGNOSIS — Z5181 Encounter for therapeutic drug level monitoring: Secondary | ICD-10-CM | POA: Diagnosis not present

## 2018-09-22 DIAGNOSIS — G43009 Migraine without aura, not intractable, without status migrainosus: Secondary | ICD-10-CM | POA: Diagnosis not present

## 2018-09-22 DIAGNOSIS — Z1389 Encounter for screening for other disorder: Secondary | ICD-10-CM | POA: Diagnosis not present

## 2018-09-22 DIAGNOSIS — G4700 Insomnia, unspecified: Secondary | ICD-10-CM | POA: Diagnosis not present

## 2018-09-22 DIAGNOSIS — Z0000 Encounter for general adult medical examination without abnormal findings: Secondary | ICD-10-CM | POA: Diagnosis not present

## 2018-09-22 DIAGNOSIS — E0590 Thyrotoxicosis, unspecified without thyrotoxic crisis or storm: Secondary | ICD-10-CM | POA: Diagnosis not present

## 2018-09-22 DIAGNOSIS — Z79899 Other long term (current) drug therapy: Secondary | ICD-10-CM | POA: Diagnosis not present

## 2018-09-22 DIAGNOSIS — Z8349 Family history of other endocrine, nutritional and metabolic diseases: Secondary | ICD-10-CM | POA: Diagnosis not present

## 2018-09-22 DIAGNOSIS — E7800 Pure hypercholesterolemia, unspecified: Secondary | ICD-10-CM | POA: Diagnosis not present

## 2018-10-19 DIAGNOSIS — D485 Neoplasm of uncertain behavior of skin: Secondary | ICD-10-CM | POA: Diagnosis not present

## 2018-10-19 DIAGNOSIS — L720 Epidermal cyst: Secondary | ICD-10-CM | POA: Diagnosis not present

## 2018-11-07 DIAGNOSIS — H52203 Unspecified astigmatism, bilateral: Secondary | ICD-10-CM | POA: Diagnosis not present

## 2018-11-07 DIAGNOSIS — H353132 Nonexudative age-related macular degeneration, bilateral, intermediate dry stage: Secondary | ICD-10-CM | POA: Diagnosis not present

## 2018-11-07 DIAGNOSIS — H04123 Dry eye syndrome of bilateral lacrimal glands: Secondary | ICD-10-CM | POA: Diagnosis not present

## 2018-11-07 DIAGNOSIS — H2513 Age-related nuclear cataract, bilateral: Secondary | ICD-10-CM | POA: Diagnosis not present

## 2019-01-19 DIAGNOSIS — Z23 Encounter for immunization: Secondary | ICD-10-CM | POA: Diagnosis not present

## 2019-01-23 ENCOUNTER — Ambulatory Visit
Admission: RE | Admit: 2019-01-23 | Discharge: 2019-01-23 | Disposition: A | Discharge status: Home / Self Care | Payer: Medicare HMO | Source: Ambulatory Visit | Attending: Internal Medicine | Admitting: Internal Medicine

## 2019-01-23 ENCOUNTER — Other Ambulatory Visit: Payer: Self-pay

## 2019-01-23 DIAGNOSIS — M8580 Other specified disorders of bone density and structure, unspecified site: Secondary | ICD-10-CM

## 2019-01-23 DIAGNOSIS — M85851 Other specified disorders of bone density and structure, right thigh: Secondary | ICD-10-CM | POA: Diagnosis not present

## 2019-01-23 DIAGNOSIS — Z780 Asymptomatic menopausal state: Secondary | ICD-10-CM | POA: Diagnosis not present

## 2019-02-02 DIAGNOSIS — Z7951 Long term (current) use of inhaled steroids: Secondary | ICD-10-CM | POA: Diagnosis not present

## 2019-02-02 DIAGNOSIS — E0590 Thyrotoxicosis, unspecified without thyrotoxic crisis or storm: Secondary | ICD-10-CM | POA: Diagnosis not present

## 2019-02-02 DIAGNOSIS — J45909 Unspecified asthma, uncomplicated: Secondary | ICD-10-CM | POA: Diagnosis not present

## 2019-02-02 DIAGNOSIS — G43909 Migraine, unspecified, not intractable, without status migrainosus: Secondary | ICD-10-CM | POA: Diagnosis not present

## 2019-02-02 DIAGNOSIS — Z833 Family history of diabetes mellitus: Secondary | ICD-10-CM | POA: Diagnosis not present

## 2019-02-02 DIAGNOSIS — Z791 Long term (current) use of non-steroidal anti-inflammatories (NSAID): Secondary | ICD-10-CM | POA: Diagnosis not present

## 2019-02-02 DIAGNOSIS — Z825 Family history of asthma and other chronic lower respiratory diseases: Secondary | ICD-10-CM | POA: Diagnosis not present

## 2019-02-02 DIAGNOSIS — Z8249 Family history of ischemic heart disease and other diseases of the circulatory system: Secondary | ICD-10-CM | POA: Diagnosis not present

## 2019-03-16 DIAGNOSIS — Z9189 Other specified personal risk factors, not elsewhere classified: Secondary | ICD-10-CM | POA: Diagnosis not present

## 2019-03-16 DIAGNOSIS — R69 Illness, unspecified: Secondary | ICD-10-CM | POA: Diagnosis not present

## 2019-03-16 DIAGNOSIS — Z779 Other contact with and (suspected) exposures hazardous to health: Secondary | ICD-10-CM | POA: Diagnosis not present

## 2019-03-16 DIAGNOSIS — Z1231 Encounter for screening mammogram for malignant neoplasm of breast: Secondary | ICD-10-CM | POA: Diagnosis not present

## 2019-03-28 DIAGNOSIS — E0500 Thyrotoxicosis with diffuse goiter without thyrotoxic crisis or storm: Secondary | ICD-10-CM | POA: Diagnosis not present

## 2019-03-28 DIAGNOSIS — E0590 Thyrotoxicosis, unspecified without thyrotoxic crisis or storm: Secondary | ICD-10-CM | POA: Diagnosis not present

## 2019-03-28 DIAGNOSIS — Z8349 Family history of other endocrine, nutritional and metabolic diseases: Secondary | ICD-10-CM | POA: Diagnosis not present

## 2019-03-28 DIAGNOSIS — Z5181 Encounter for therapeutic drug level monitoring: Secondary | ICD-10-CM | POA: Diagnosis not present

## 2019-04-13 DIAGNOSIS — Z0100 Encounter for examination of eyes and vision without abnormal findings: Secondary | ICD-10-CM | POA: Diagnosis not present

## 2019-05-04 DIAGNOSIS — E0590 Thyrotoxicosis, unspecified without thyrotoxic crisis or storm: Secondary | ICD-10-CM | POA: Diagnosis not present

## 2019-05-06 ENCOUNTER — Ambulatory Visit: Payer: Managed Care, Other (non HMO) | Attending: Internal Medicine

## 2019-05-06 DIAGNOSIS — Z23 Encounter for immunization: Secondary | ICD-10-CM | POA: Insufficient documentation

## 2019-05-06 NOTE — Progress Notes (Signed)
   Covid-19 Vaccination Clinic  Name:  Bonnie Dodson    MRN: [id number] DOB: 05/13/1950  05/06/2019  [name] was observed post Covid-19 immunization for 15 minutes without incidence. She was provided with Vaccine Information Sheet and instruction to access the V-Safe system.   [name] was instructed to call 911 with any severe reactions post vaccine: Marland Kitchen Difficulty breathing  . Swelling of your face and throat  . A fast heartbeat  . A bad rash all over your body  . Dizziness and weakness    Immunizations Administered    Name Date Dose VIS Date Route   Pfizer COVID-19 Vaccine 05/06/2019  3:10 PM 0.3 mL 03/24/2019 Intramuscular   Manufacturer: [place]   Lot: [id number]   [place]: [id number]

## 2019-05-28 ENCOUNTER — Ambulatory Visit: Payer: Medicare HMO | Attending: Internal Medicine

## 2019-05-28 DIAGNOSIS — Z23 Encounter for immunization: Secondary | ICD-10-CM

## 2019-05-28 NOTE — Progress Notes (Signed)
   Covid-19 Vaccination Clinic  Name:  Alesha Krogmann    MRN: [id number] DOB: 1950/09/23  05/28/2019  Ms. Miracle was observed post Covid-19 immunization for 15 minutes without incidence. She was provided with Vaccine Information Sheet and instruction to access the V-Safe system.   Ms. Roye was instructed to call 911 with any severe reactions post vaccine: Marland Kitchen Difficulty breathing  . Swelling of your face and throat  . A fast heartbeat  . A bad rash all over your body  . Dizziness and weakness    Immunizations Administered    Name Date Dose VIS Date Route   Pfizer COVID-19 Vaccine 05/28/2019  1:31 PM 0.3 mL 03/24/2019 Intramuscular   Manufacturer: [place]   Lot: [id number]   [place]: [id number]

## 2019-06-29 DIAGNOSIS — D485 Neoplasm of uncertain behavior of skin: Secondary | ICD-10-CM | POA: Diagnosis not present

## 2019-06-29 DIAGNOSIS — D2272 Melanocytic nevi of left lower limb, including hip: Secondary | ICD-10-CM | POA: Diagnosis not present

## 2019-06-29 DIAGNOSIS — D2271 Melanocytic nevi of right lower limb, including hip: Secondary | ICD-10-CM | POA: Diagnosis not present

## 2019-06-29 DIAGNOSIS — D225 Melanocytic nevi of trunk: Secondary | ICD-10-CM | POA: Diagnosis not present

## 2019-06-29 DIAGNOSIS — Z808 Family history of malignant neoplasm of other organs or systems: Secondary | ICD-10-CM | POA: Diagnosis not present

## 2019-06-29 DIAGNOSIS — C44329 Squamous cell carcinoma of skin of other parts of face: Secondary | ICD-10-CM | POA: Diagnosis not present

## 2019-06-29 DIAGNOSIS — Z23 Encounter for immunization: Secondary | ICD-10-CM | POA: Diagnosis not present

## 2019-06-29 DIAGNOSIS — L578 Other skin changes due to chronic exposure to nonionizing radiation: Secondary | ICD-10-CM | POA: Diagnosis not present

## 2019-06-29 DIAGNOSIS — L814 Other melanin hyperpigmentation: Secondary | ICD-10-CM | POA: Diagnosis not present

## 2019-06-29 DIAGNOSIS — L821 Other seborrheic keratosis: Secondary | ICD-10-CM | POA: Diagnosis not present

## 2019-06-29 DIAGNOSIS — L723 Sebaceous cyst: Secondary | ICD-10-CM | POA: Diagnosis not present

## 2019-07-20 DIAGNOSIS — C44329 Squamous cell carcinoma of skin of other parts of face: Secondary | ICD-10-CM | POA: Diagnosis not present

## 2019-08-09 DIAGNOSIS — E0590 Thyrotoxicosis, unspecified without thyrotoxic crisis or storm: Secondary | ICD-10-CM | POA: Diagnosis not present

## 2019-08-24 DIAGNOSIS — Z823 Family history of stroke: Secondary | ICD-10-CM | POA: Diagnosis not present

## 2019-08-24 DIAGNOSIS — R69 Illness, unspecified: Secondary | ICD-10-CM | POA: Diagnosis not present

## 2019-08-24 DIAGNOSIS — J45909 Unspecified asthma, uncomplicated: Secondary | ICD-10-CM | POA: Diagnosis not present

## 2019-08-24 DIAGNOSIS — R030 Elevated blood-pressure reading, without diagnosis of hypertension: Secondary | ICD-10-CM | POA: Diagnosis not present

## 2019-08-24 DIAGNOSIS — E0590 Thyrotoxicosis, unspecified without thyrotoxic crisis or storm: Secondary | ICD-10-CM | POA: Diagnosis not present

## 2019-08-24 DIAGNOSIS — Z833 Family history of diabetes mellitus: Secondary | ICD-10-CM | POA: Diagnosis not present

## 2019-09-21 DIAGNOSIS — R69 Illness, unspecified: Secondary | ICD-10-CM | POA: Diagnosis not present

## 2019-09-27 DIAGNOSIS — E7800 Pure hypercholesterolemia, unspecified: Secondary | ICD-10-CM | POA: Diagnosis not present

## 2019-09-27 DIAGNOSIS — J4520 Mild intermittent asthma, uncomplicated: Secondary | ICD-10-CM | POA: Diagnosis not present

## 2019-09-27 DIAGNOSIS — G43009 Migraine without aura, not intractable, without status migrainosus: Secondary | ICD-10-CM | POA: Diagnosis not present

## 2019-09-27 DIAGNOSIS — Z0000 Encounter for general adult medical examination without abnormal findings: Secondary | ICD-10-CM | POA: Diagnosis not present

## 2019-09-27 DIAGNOSIS — E0590 Thyrotoxicosis, unspecified without thyrotoxic crisis or storm: Secondary | ICD-10-CM | POA: Diagnosis not present

## 2019-09-27 DIAGNOSIS — Z1389 Encounter for screening for other disorder: Secondary | ICD-10-CM | POA: Diagnosis not present

## 2019-09-27 DIAGNOSIS — M8580 Other specified disorders of bone density and structure, unspecified site: Secondary | ICD-10-CM | POA: Diagnosis not present

## 2019-09-28 DIAGNOSIS — E0590 Thyrotoxicosis, unspecified without thyrotoxic crisis or storm: Secondary | ICD-10-CM | POA: Diagnosis not present

## 2019-09-28 DIAGNOSIS — Z8349 Family history of other endocrine, nutritional and metabolic diseases: Secondary | ICD-10-CM | POA: Diagnosis not present

## 2019-09-28 DIAGNOSIS — E0500 Thyrotoxicosis with diffuse goiter without thyrotoxic crisis or storm: Secondary | ICD-10-CM | POA: Diagnosis not present

## 2019-10-03 DIAGNOSIS — L989 Disorder of the skin and subcutaneous tissue, unspecified: Secondary | ICD-10-CM | POA: Diagnosis not present

## 2019-10-03 DIAGNOSIS — L720 Epidermal cyst: Secondary | ICD-10-CM | POA: Diagnosis not present

## 2019-10-03 DIAGNOSIS — L723 Sebaceous cyst: Secondary | ICD-10-CM | POA: Diagnosis not present

## 2019-12-22 DIAGNOSIS — Z20822 Contact with and (suspected) exposure to covid-19: Secondary | ICD-10-CM | POA: Diagnosis not present

## 2020-01-01 DIAGNOSIS — H18593 Other hereditary corneal dystrophies, bilateral: Secondary | ICD-10-CM | POA: Diagnosis not present

## 2020-01-01 DIAGNOSIS — H2513 Age-related nuclear cataract, bilateral: Secondary | ICD-10-CM | POA: Diagnosis not present

## 2020-01-01 DIAGNOSIS — H353132 Nonexudative age-related macular degeneration, bilateral, intermediate dry stage: Secondary | ICD-10-CM | POA: Diagnosis not present

## 2020-01-01 DIAGNOSIS — H5203 Hypermetropia, bilateral: Secondary | ICD-10-CM | POA: Diagnosis not present

## 2020-02-01 DIAGNOSIS — Z23 Encounter for immunization: Secondary | ICD-10-CM | POA: Diagnosis not present

## 2020-02-21 ENCOUNTER — Ambulatory Visit: Payer: Medicare HMO | Attending: Internal Medicine

## 2020-02-21 ENCOUNTER — Other Ambulatory Visit (HOSPITAL_COMMUNITY): Payer: Self-pay | Admitting: Internal Medicine

## 2020-02-21 DIAGNOSIS — Z23 Encounter for immunization: Secondary | ICD-10-CM

## 2020-02-21 NOTE — Progress Notes (Signed)
   Covid-19 Vaccination Clinic  Name:  Bonnie Dodson    MRN: [id number] DOB: 1950-07-05  02/21/2020  [name] was observed post Covid-19 immunization for 15 minutes without incident. She was provided with Vaccine Information Sheet and instruction to access the V-Safe system.   [name] was instructed to call 911 with any severe reactions post vaccine: Marland Kitchen Difficulty breathing  . Swelling of face and throat  . A fast heartbeat  . A bad rash all over body  . Dizziness and weakness

## 2020-04-02 DIAGNOSIS — R69 Illness, unspecified: Secondary | ICD-10-CM | POA: Diagnosis not present

## 2020-04-11 DIAGNOSIS — Z01411 Encounter for gynecological examination (general) (routine) with abnormal findings: Secondary | ICD-10-CM | POA: Diagnosis not present

## 2020-04-11 DIAGNOSIS — Z124 Encounter for screening for malignant neoplasm of cervix: Secondary | ICD-10-CM | POA: Diagnosis not present

## 2020-04-11 DIAGNOSIS — Z01419 Encounter for gynecological examination (general) (routine) without abnormal findings: Secondary | ICD-10-CM | POA: Diagnosis not present

## 2020-04-11 DIAGNOSIS — M8580 Other specified disorders of bone density and structure, unspecified site: Secondary | ICD-10-CM | POA: Diagnosis not present

## 2020-04-11 DIAGNOSIS — Z1231 Encounter for screening mammogram for malignant neoplasm of breast: Secondary | ICD-10-CM | POA: Diagnosis not present

## 2020-04-11 DIAGNOSIS — Z0000 Encounter for general adult medical examination without abnormal findings: Secondary | ICD-10-CM | POA: Diagnosis not present

## 2020-04-11 DIAGNOSIS — Z9189 Other specified personal risk factors, not elsewhere classified: Secondary | ICD-10-CM | POA: Diagnosis not present

## 2020-04-11 DIAGNOSIS — Z6822 Body mass index (BMI) 22.0-22.9, adult: Secondary | ICD-10-CM | POA: Diagnosis not present

## 2020-04-11 DIAGNOSIS — Z779 Other contact with and (suspected) exposures hazardous to health: Secondary | ICD-10-CM | POA: Diagnosis not present

## 2020-07-03 DIAGNOSIS — L578 Other skin changes due to chronic exposure to nonionizing radiation: Secondary | ICD-10-CM | POA: Diagnosis not present

## 2020-07-03 DIAGNOSIS — D2271 Melanocytic nevi of right lower limb, including hip: Secondary | ICD-10-CM | POA: Diagnosis not present

## 2020-07-03 DIAGNOSIS — L570 Actinic keratosis: Secondary | ICD-10-CM | POA: Diagnosis not present

## 2020-07-03 DIAGNOSIS — L814 Other melanin hyperpigmentation: Secondary | ICD-10-CM | POA: Diagnosis not present

## 2020-07-03 DIAGNOSIS — Z808 Family history of malignant neoplasm of other organs or systems: Secondary | ICD-10-CM | POA: Diagnosis not present

## 2020-07-03 DIAGNOSIS — D225 Melanocytic nevi of trunk: Secondary | ICD-10-CM | POA: Diagnosis not present

## 2020-07-03 DIAGNOSIS — D2272 Melanocytic nevi of left lower limb, including hip: Secondary | ICD-10-CM | POA: Diagnosis not present

## 2020-07-03 DIAGNOSIS — L821 Other seborrheic keratosis: Secondary | ICD-10-CM | POA: Diagnosis not present

## 2020-07-03 DIAGNOSIS — L723 Sebaceous cyst: Secondary | ICD-10-CM | POA: Diagnosis not present

## 2020-10-15 DIAGNOSIS — E7800 Pure hypercholesterolemia, unspecified: Secondary | ICD-10-CM | POA: Diagnosis not present

## 2020-10-15 DIAGNOSIS — G43009 Migraine without aura, not intractable, without status migrainosus: Secondary | ICD-10-CM | POA: Diagnosis not present

## 2020-10-15 DIAGNOSIS — E0590 Thyrotoxicosis, unspecified without thyrotoxic crisis or storm: Secondary | ICD-10-CM | POA: Diagnosis not present

## 2020-10-15 DIAGNOSIS — M8580 Other specified disorders of bone density and structure, unspecified site: Secondary | ICD-10-CM | POA: Diagnosis not present

## 2020-10-15 DIAGNOSIS — Z0000 Encounter for general adult medical examination without abnormal findings: Secondary | ICD-10-CM | POA: Diagnosis not present

## 2020-10-15 DIAGNOSIS — J4520 Mild intermittent asthma, uncomplicated: Secondary | ICD-10-CM | POA: Diagnosis not present

## 2020-10-15 DIAGNOSIS — Z1389 Encounter for screening for other disorder: Secondary | ICD-10-CM | POA: Diagnosis not present

## 2021-01-01 DIAGNOSIS — H353132 Nonexudative age-related macular degeneration, bilateral, intermediate dry stage: Secondary | ICD-10-CM | POA: Diagnosis not present

## 2021-01-01 DIAGNOSIS — H524 Presbyopia: Secondary | ICD-10-CM | POA: Diagnosis not present

## 2021-01-01 DIAGNOSIS — H2513 Age-related nuclear cataract, bilateral: Secondary | ICD-10-CM | POA: Diagnosis not present

## 2021-01-01 DIAGNOSIS — H1789 Other corneal scars and opacities: Secondary | ICD-10-CM | POA: Diagnosis not present

## 2021-01-20 DIAGNOSIS — H35033 Hypertensive retinopathy, bilateral: Secondary | ICD-10-CM | POA: Diagnosis not present

## 2021-01-20 DIAGNOSIS — H43823 Vitreomacular adhesion, bilateral: Secondary | ICD-10-CM | POA: Diagnosis not present

## 2021-01-20 DIAGNOSIS — H2513 Age-related nuclear cataract, bilateral: Secondary | ICD-10-CM | POA: Diagnosis not present

## 2021-01-20 DIAGNOSIS — H353132 Nonexudative age-related macular degeneration, bilateral, intermediate dry stage: Secondary | ICD-10-CM | POA: Diagnosis not present

## 2021-05-06 DIAGNOSIS — Z01411 Encounter for gynecological examination (general) (routine) with abnormal findings: Secondary | ICD-10-CM | POA: Diagnosis not present

## 2021-05-06 DIAGNOSIS — Z01419 Encounter for gynecological examination (general) (routine) without abnormal findings: Secondary | ICD-10-CM | POA: Diagnosis not present

## 2021-05-06 DIAGNOSIS — Z1231 Encounter for screening mammogram for malignant neoplasm of breast: Secondary | ICD-10-CM | POA: Diagnosis not present

## 2021-05-06 DIAGNOSIS — R69 Illness, unspecified: Secondary | ICD-10-CM | POA: Diagnosis not present

## 2021-05-06 DIAGNOSIS — Z6821 Body mass index (BMI) 21.0-21.9, adult: Secondary | ICD-10-CM | POA: Diagnosis not present

## 2021-05-06 DIAGNOSIS — Z124 Encounter for screening for malignant neoplasm of cervix: Secondary | ICD-10-CM | POA: Diagnosis not present

## 2021-05-19 DIAGNOSIS — H35033 Hypertensive retinopathy, bilateral: Secondary | ICD-10-CM | POA: Diagnosis not present

## 2021-05-19 DIAGNOSIS — H353132 Nonexudative age-related macular degeneration, bilateral, intermediate dry stage: Secondary | ICD-10-CM | POA: Diagnosis not present

## 2021-05-19 DIAGNOSIS — H2513 Age-related nuclear cataract, bilateral: Secondary | ICD-10-CM | POA: Diagnosis not present

## 2021-05-19 DIAGNOSIS — H43823 Vitreomacular adhesion, bilateral: Secondary | ICD-10-CM | POA: Diagnosis not present

## 2021-07-10 DIAGNOSIS — L821 Other seborrheic keratosis: Secondary | ICD-10-CM | POA: Diagnosis not present

## 2021-07-10 DIAGNOSIS — L814 Other melanin hyperpigmentation: Secondary | ICD-10-CM | POA: Diagnosis not present

## 2021-07-10 DIAGNOSIS — D2271 Melanocytic nevi of right lower limb, including hip: Secondary | ICD-10-CM | POA: Diagnosis not present

## 2021-07-10 DIAGNOSIS — D2272 Melanocytic nevi of left lower limb, including hip: Secondary | ICD-10-CM | POA: Diagnosis not present

## 2021-07-10 DIAGNOSIS — D225 Melanocytic nevi of trunk: Secondary | ICD-10-CM | POA: Diagnosis not present

## 2021-07-10 DIAGNOSIS — L820 Inflamed seborrheic keratosis: Secondary | ICD-10-CM | POA: Diagnosis not present

## 2021-07-10 DIAGNOSIS — L578 Other skin changes due to chronic exposure to nonionizing radiation: Secondary | ICD-10-CM | POA: Diagnosis not present

## 2021-07-10 DIAGNOSIS — Z808 Family history of malignant neoplasm of other organs or systems: Secondary | ICD-10-CM | POA: Diagnosis not present

## 2021-07-26 DIAGNOSIS — H16041 Marginal corneal ulcer, right eye: Secondary | ICD-10-CM | POA: Diagnosis not present

## 2021-07-26 DIAGNOSIS — H1031 Unspecified acute conjunctivitis, right eye: Secondary | ICD-10-CM | POA: Diagnosis not present

## 2021-07-28 DIAGNOSIS — H16041 Marginal corneal ulcer, right eye: Secondary | ICD-10-CM | POA: Diagnosis not present

## 2021-07-30 DIAGNOSIS — H1033 Unspecified acute conjunctivitis, bilateral: Secondary | ICD-10-CM | POA: Diagnosis not present

## 2021-07-30 DIAGNOSIS — H1789 Other corneal scars and opacities: Secondary | ICD-10-CM | POA: Diagnosis not present

## 2021-08-11 DIAGNOSIS — Z0100 Encounter for examination of eyes and vision without abnormal findings: Secondary | ICD-10-CM | POA: Diagnosis not present

## 2021-09-15 DIAGNOSIS — H35033 Hypertensive retinopathy, bilateral: Secondary | ICD-10-CM | POA: Diagnosis not present

## 2021-09-15 DIAGNOSIS — H353132 Nonexudative age-related macular degeneration, bilateral, intermediate dry stage: Secondary | ICD-10-CM | POA: Diagnosis not present

## 2021-09-15 DIAGNOSIS — H2513 Age-related nuclear cataract, bilateral: Secondary | ICD-10-CM | POA: Diagnosis not present

## 2021-09-15 DIAGNOSIS — H43823 Vitreomacular adhesion, bilateral: Secondary | ICD-10-CM | POA: Diagnosis not present

## 2021-10-15 DIAGNOSIS — M8580 Other specified disorders of bone density and structure, unspecified site: Secondary | ICD-10-CM | POA: Diagnosis not present

## 2021-10-15 DIAGNOSIS — Z1331 Encounter for screening for depression: Secondary | ICD-10-CM | POA: Diagnosis not present

## 2021-10-15 DIAGNOSIS — E0590 Thyrotoxicosis, unspecified without thyrotoxic crisis or storm: Secondary | ICD-10-CM | POA: Diagnosis not present

## 2021-10-15 DIAGNOSIS — G43009 Migraine without aura, not intractable, without status migrainosus: Secondary | ICD-10-CM | POA: Diagnosis not present

## 2021-10-15 DIAGNOSIS — J4520 Mild intermittent asthma, uncomplicated: Secondary | ICD-10-CM | POA: Diagnosis not present

## 2021-10-15 DIAGNOSIS — E7800 Pure hypercholesterolemia, unspecified: Secondary | ICD-10-CM | POA: Diagnosis not present

## 2021-10-15 DIAGNOSIS — Z0000 Encounter for general adult medical examination without abnormal findings: Secondary | ICD-10-CM | POA: Diagnosis not present

## 2021-11-14 ENCOUNTER — Other Ambulatory Visit: Payer: Self-pay | Admitting: Internal Medicine

## 2021-11-14 DIAGNOSIS — E7800 Pure hypercholesterolemia, unspecified: Secondary | ICD-10-CM

## 2022-01-06 DIAGNOSIS — H2513 Age-related nuclear cataract, bilateral: Secondary | ICD-10-CM | POA: Diagnosis not present

## 2022-01-06 DIAGNOSIS — H5203 Hypermetropia, bilateral: Secondary | ICD-10-CM | POA: Diagnosis not present

## 2022-01-06 DIAGNOSIS — H25013 Cortical age-related cataract, bilateral: Secondary | ICD-10-CM | POA: Diagnosis not present

## 2022-01-06 DIAGNOSIS — H524 Presbyopia: Secondary | ICD-10-CM | POA: Diagnosis not present

## 2022-01-06 DIAGNOSIS — H353132 Nonexudative age-related macular degeneration, bilateral, intermediate dry stage: Secondary | ICD-10-CM | POA: Diagnosis not present

## 2022-01-26 ENCOUNTER — Ambulatory Visit
Admission: RE | Admit: 2022-01-26 | Discharge: 2022-01-26 | Disposition: A | Discharge status: Home / Self Care | Payer: No Typology Code available for payment source | Source: Ambulatory Visit | Attending: Internal Medicine | Admitting: Internal Medicine

## 2022-01-26 DIAGNOSIS — E7800 Pure hypercholesterolemia, unspecified: Secondary | ICD-10-CM

## 2022-04-14 DIAGNOSIS — R69 Illness, unspecified: Secondary | ICD-10-CM | POA: Diagnosis not present

## 2022-04-21 DIAGNOSIS — H353123 Nonexudative age-related macular degeneration, left eye, advanced atrophic without subfoveal involvement: Secondary | ICD-10-CM | POA: Diagnosis not present

## 2022-04-21 DIAGNOSIS — H353112 Nonexudative age-related macular degeneration, right eye, intermediate dry stage: Secondary | ICD-10-CM | POA: Diagnosis not present

## 2022-04-21 DIAGNOSIS — H35033 Hypertensive retinopathy, bilateral: Secondary | ICD-10-CM | POA: Diagnosis not present

## 2022-04-21 DIAGNOSIS — H43823 Vitreomacular adhesion, bilateral: Secondary | ICD-10-CM | POA: Diagnosis not present

## 2022-05-12 DIAGNOSIS — Z0000 Encounter for general adult medical examination without abnormal findings: Secondary | ICD-10-CM | POA: Diagnosis not present

## 2022-05-12 DIAGNOSIS — R69 Illness, unspecified: Secondary | ICD-10-CM | POA: Diagnosis not present

## 2022-05-12 DIAGNOSIS — Z9189 Other specified personal risk factors, not elsewhere classified: Secondary | ICD-10-CM | POA: Diagnosis not present

## 2022-05-12 DIAGNOSIS — Z124 Encounter for screening for malignant neoplasm of cervix: Secondary | ICD-10-CM | POA: Diagnosis not present

## 2022-05-12 DIAGNOSIS — Z6822 Body mass index (BMI) 22.0-22.9, adult: Secondary | ICD-10-CM | POA: Diagnosis not present

## 2022-05-12 DIAGNOSIS — Z1231 Encounter for screening mammogram for malignant neoplasm of breast: Secondary | ICD-10-CM | POA: Diagnosis not present

## 2022-05-12 DIAGNOSIS — Z01419 Encounter for gynecological examination (general) (routine) without abnormal findings: Secondary | ICD-10-CM | POA: Diagnosis not present

## 2022-05-12 DIAGNOSIS — Z01411 Encounter for gynecological examination (general) (routine) with abnormal findings: Secondary | ICD-10-CM | POA: Diagnosis not present

## 2022-05-19 ENCOUNTER — Other Ambulatory Visit: Payer: Self-pay | Admitting: Internal Medicine

## 2022-05-19 DIAGNOSIS — M8580 Other specified disorders of bone density and structure, unspecified site: Secondary | ICD-10-CM

## 2022-05-22 ENCOUNTER — Ambulatory Visit
Admission: RE | Admit: 2022-05-22 | Discharge: 2022-05-22 | Disposition: A | Discharge status: Home / Self Care | Payer: Medicare HMO | Source: Ambulatory Visit | Attending: Internal Medicine | Admitting: Internal Medicine

## 2022-05-22 DIAGNOSIS — Z780 Asymptomatic menopausal state: Secondary | ICD-10-CM | POA: Diagnosis not present

## 2022-05-22 DIAGNOSIS — M8580 Other specified disorders of bone density and structure, unspecified site: Secondary | ICD-10-CM

## 2022-05-22 DIAGNOSIS — M85852 Other specified disorders of bone density and structure, left thigh: Secondary | ICD-10-CM | POA: Diagnosis not present

## 2022-05-22 DIAGNOSIS — M810 Age-related osteoporosis without current pathological fracture: Secondary | ICD-10-CM | POA: Diagnosis not present

## 2022-06-10 DIAGNOSIS — M810 Age-related osteoporosis without current pathological fracture: Secondary | ICD-10-CM | POA: Diagnosis not present

## 2022-06-16 DIAGNOSIS — Z0100 Encounter for examination of eyes and vision without abnormal findings: Secondary | ICD-10-CM | POA: Diagnosis not present

## 2022-06-22 DIAGNOSIS — R69 Illness, unspecified: Secondary | ICD-10-CM | POA: Diagnosis not present

## 2022-07-15 DIAGNOSIS — L814 Other melanin hyperpigmentation: Secondary | ICD-10-CM | POA: Diagnosis not present

## 2022-07-15 DIAGNOSIS — L570 Actinic keratosis: Secondary | ICD-10-CM | POA: Diagnosis not present

## 2022-07-15 DIAGNOSIS — D2271 Melanocytic nevi of right lower limb, including hip: Secondary | ICD-10-CM | POA: Diagnosis not present

## 2022-07-15 DIAGNOSIS — Z808 Family history of malignant neoplasm of other organs or systems: Secondary | ICD-10-CM | POA: Diagnosis not present

## 2022-07-15 DIAGNOSIS — D2272 Melanocytic nevi of left lower limb, including hip: Secondary | ICD-10-CM | POA: Diagnosis not present

## 2022-07-15 DIAGNOSIS — L720 Epidermal cyst: Secondary | ICD-10-CM | POA: Diagnosis not present

## 2022-07-15 DIAGNOSIS — L578 Other skin changes due to chronic exposure to nonionizing radiation: Secondary | ICD-10-CM | POA: Diagnosis not present

## 2022-07-15 DIAGNOSIS — D225 Melanocytic nevi of trunk: Secondary | ICD-10-CM | POA: Diagnosis not present

## 2022-07-15 DIAGNOSIS — L821 Other seborrheic keratosis: Secondary | ICD-10-CM | POA: Diagnosis not present

## 2022-07-15 DIAGNOSIS — D485 Neoplasm of uncertain behavior of skin: Secondary | ICD-10-CM | POA: Diagnosis not present

## 2022-09-22 DIAGNOSIS — R69 Illness, unspecified: Secondary | ICD-10-CM | POA: Diagnosis not present

## 2022-10-29 DIAGNOSIS — E0500 Thyrotoxicosis with diffuse goiter without thyrotoxic crisis or storm: Secondary | ICD-10-CM | POA: Diagnosis not present

## 2022-10-29 DIAGNOSIS — Z6821 Body mass index (BMI) 21.0-21.9, adult: Secondary | ICD-10-CM | POA: Diagnosis not present

## 2022-10-29 DIAGNOSIS — E7800 Pure hypercholesterolemia, unspecified: Secondary | ICD-10-CM | POA: Diagnosis not present

## 2022-10-29 DIAGNOSIS — M816 Localized osteoporosis [Lequesne]: Secondary | ICD-10-CM | POA: Diagnosis not present

## 2022-10-29 DIAGNOSIS — Z0000 Encounter for general adult medical examination without abnormal findings: Secondary | ICD-10-CM | POA: Diagnosis not present

## 2022-10-29 DIAGNOSIS — E0590 Thyrotoxicosis, unspecified without thyrotoxic crisis or storm: Secondary | ICD-10-CM | POA: Diagnosis not present

## 2022-11-16 DIAGNOSIS — R945 Abnormal results of liver function studies: Secondary | ICD-10-CM | POA: Diagnosis not present

## 2022-11-23 DIAGNOSIS — R69 Illness, unspecified: Secondary | ICD-10-CM | POA: Diagnosis not present

## 2022-12-22 DIAGNOSIS — H2513 Age-related nuclear cataract, bilateral: Secondary | ICD-10-CM | POA: Diagnosis not present

## 2022-12-22 DIAGNOSIS — H43823 Vitreomacular adhesion, bilateral: Secondary | ICD-10-CM | POA: Diagnosis not present

## 2022-12-22 DIAGNOSIS — H35033 Hypertensive retinopathy, bilateral: Secondary | ICD-10-CM | POA: Diagnosis not present

## 2022-12-22 DIAGNOSIS — H353112 Nonexudative age-related macular degeneration, right eye, intermediate dry stage: Secondary | ICD-10-CM | POA: Diagnosis not present

## 2022-12-22 DIAGNOSIS — H353123 Nonexudative age-related macular degeneration, left eye, advanced atrophic without subfoveal involvement: Secondary | ICD-10-CM | POA: Diagnosis not present

## 2023-01-12 DIAGNOSIS — H25013 Cortical age-related cataract, bilateral: Secondary | ICD-10-CM | POA: Diagnosis not present

## 2023-01-12 DIAGNOSIS — H2513 Age-related nuclear cataract, bilateral: Secondary | ICD-10-CM | POA: Diagnosis not present

## 2023-01-12 DIAGNOSIS — H52203 Unspecified astigmatism, bilateral: Secondary | ICD-10-CM | POA: Diagnosis not present

## 2023-01-12 DIAGNOSIS — H524 Presbyopia: Secondary | ICD-10-CM | POA: Diagnosis not present

## 2023-01-12 DIAGNOSIS — H1789 Other corneal scars and opacities: Secondary | ICD-10-CM | POA: Diagnosis not present

## 2023-01-12 DIAGNOSIS — H04123 Dry eye syndrome of bilateral lacrimal glands: Secondary | ICD-10-CM | POA: Diagnosis not present

## 2023-01-12 DIAGNOSIS — H353132 Nonexudative age-related macular degeneration, bilateral, intermediate dry stage: Secondary | ICD-10-CM | POA: Diagnosis not present

## 2023-01-12 DIAGNOSIS — H18593 Other hereditary corneal dystrophies, bilateral: Secondary | ICD-10-CM | POA: Diagnosis not present

## 2023-01-12 DIAGNOSIS — H5203 Hypermetropia, bilateral: Secondary | ICD-10-CM | POA: Diagnosis not present

## 2023-01-18 DIAGNOSIS — H353123 Nonexudative age-related macular degeneration, left eye, advanced atrophic without subfoveal involvement: Secondary | ICD-10-CM | POA: Diagnosis not present

## 2023-02-15 DIAGNOSIS — H35033 Hypertensive retinopathy, bilateral: Secondary | ICD-10-CM | POA: Diagnosis not present

## 2023-02-15 DIAGNOSIS — H2513 Age-related nuclear cataract, bilateral: Secondary | ICD-10-CM | POA: Diagnosis not present

## 2023-02-15 DIAGNOSIS — H353112 Nonexudative age-related macular degeneration, right eye, intermediate dry stage: Secondary | ICD-10-CM | POA: Diagnosis not present

## 2023-02-15 DIAGNOSIS — H43823 Vitreomacular adhesion, bilateral: Secondary | ICD-10-CM | POA: Diagnosis not present

## 2023-02-15 DIAGNOSIS — H353123 Nonexudative age-related macular degeneration, left eye, advanced atrophic without subfoveal involvement: Secondary | ICD-10-CM | POA: Diagnosis not present

## 2023-02-25 DIAGNOSIS — R58 Hemorrhage, not elsewhere classified: Secondary | ICD-10-CM | POA: Diagnosis not present

## 2023-03-01 DIAGNOSIS — R69 Illness, unspecified: Secondary | ICD-10-CM | POA: Diagnosis not present

## 2023-04-12 DIAGNOSIS — H353120 Nonexudative age-related macular degeneration, left eye, stage unspecified: Secondary | ICD-10-CM | POA: Diagnosis not present

## 2023-05-11 DIAGNOSIS — M816 Localized osteoporosis [Lequesne]: Secondary | ICD-10-CM | POA: Diagnosis not present

## 2023-05-11 DIAGNOSIS — R945 Abnormal results of liver function studies: Secondary | ICD-10-CM | POA: Diagnosis not present

## 2023-05-11 DIAGNOSIS — E0590 Thyrotoxicosis, unspecified without thyrotoxic crisis or storm: Secondary | ICD-10-CM | POA: Diagnosis not present

## 2023-05-11 DIAGNOSIS — E7800 Pure hypercholesterolemia, unspecified: Secondary | ICD-10-CM | POA: Diagnosis not present

## 2023-05-11 DIAGNOSIS — Z6821 Body mass index (BMI) 21.0-21.9, adult: Secondary | ICD-10-CM | POA: Diagnosis not present

## 2023-05-17 DIAGNOSIS — Z1231 Encounter for screening mammogram for malignant neoplasm of breast: Secondary | ICD-10-CM | POA: Diagnosis not present

## 2023-05-17 DIAGNOSIS — Z1331 Encounter for screening for depression: Secondary | ICD-10-CM | POA: Diagnosis not present

## 2023-05-17 DIAGNOSIS — Z01419 Encounter for gynecological examination (general) (routine) without abnormal findings: Secondary | ICD-10-CM | POA: Diagnosis not present

## 2023-06-07 DIAGNOSIS — H353123 Nonexudative age-related macular degeneration, left eye, advanced atrophic without subfoveal involvement: Secondary | ICD-10-CM | POA: Diagnosis not present

## 2023-07-21 DIAGNOSIS — L821 Other seborrheic keratosis: Secondary | ICD-10-CM | POA: Diagnosis not present

## 2023-07-21 DIAGNOSIS — D2272 Melanocytic nevi of left lower limb, including hip: Secondary | ICD-10-CM | POA: Diagnosis not present

## 2023-07-21 DIAGNOSIS — L570 Actinic keratosis: Secondary | ICD-10-CM | POA: Diagnosis not present

## 2023-07-21 DIAGNOSIS — L578 Other skin changes due to chronic exposure to nonionizing radiation: Secondary | ICD-10-CM | POA: Diagnosis not present

## 2023-07-21 DIAGNOSIS — Z808 Family history of malignant neoplasm of other organs or systems: Secondary | ICD-10-CM | POA: Diagnosis not present

## 2023-07-21 DIAGNOSIS — L720 Epidermal cyst: Secondary | ICD-10-CM | POA: Diagnosis not present

## 2023-07-21 DIAGNOSIS — D2271 Melanocytic nevi of right lower limb, including hip: Secondary | ICD-10-CM | POA: Diagnosis not present

## 2023-07-21 DIAGNOSIS — D225 Melanocytic nevi of trunk: Secondary | ICD-10-CM | POA: Diagnosis not present

## 2023-07-21 DIAGNOSIS — L814 Other melanin hyperpigmentation: Secondary | ICD-10-CM | POA: Diagnosis not present

## 2023-08-02 DIAGNOSIS — H353112 Nonexudative age-related macular degeneration, right eye, intermediate dry stage: Secondary | ICD-10-CM | POA: Diagnosis not present

## 2023-08-02 DIAGNOSIS — H43823 Vitreomacular adhesion, bilateral: Secondary | ICD-10-CM | POA: Diagnosis not present

## 2023-08-02 DIAGNOSIS — H35033 Hypertensive retinopathy, bilateral: Secondary | ICD-10-CM | POA: Diagnosis not present

## 2023-08-02 DIAGNOSIS — H353123 Nonexudative age-related macular degeneration, left eye, advanced atrophic without subfoveal involvement: Secondary | ICD-10-CM | POA: Diagnosis not present

## 2023-08-02 DIAGNOSIS — H2513 Age-related nuclear cataract, bilateral: Secondary | ICD-10-CM | POA: Diagnosis not present

## 2023-08-06 DIAGNOSIS — E0590 Thyrotoxicosis, unspecified without thyrotoxic crisis or storm: Secondary | ICD-10-CM | POA: Diagnosis not present

## 2023-08-19 DIAGNOSIS — E0590 Thyrotoxicosis, unspecified without thyrotoxic crisis or storm: Secondary | ICD-10-CM | POA: Diagnosis not present

## 2023-09-22 DIAGNOSIS — M79641 Pain in right hand: Secondary | ICD-10-CM | POA: Diagnosis not present

## 2023-09-22 DIAGNOSIS — M19031 Primary osteoarthritis, right wrist: Secondary | ICD-10-CM | POA: Diagnosis not present

## 2023-09-27 DIAGNOSIS — H353123 Nonexudative age-related macular degeneration, left eye, advanced atrophic without subfoveal involvement: Secondary | ICD-10-CM | POA: Diagnosis not present

## 2023-10-14 DIAGNOSIS — E0590 Thyrotoxicosis, unspecified without thyrotoxic crisis or storm: Secondary | ICD-10-CM | POA: Diagnosis not present

## 2023-11-15 DIAGNOSIS — H353123 Nonexudative age-related macular degeneration, left eye, advanced atrophic without subfoveal involvement: Secondary | ICD-10-CM | POA: Diagnosis not present

## 2023-11-16 DIAGNOSIS — J4520 Mild intermittent asthma, uncomplicated: Secondary | ICD-10-CM | POA: Diagnosis not present

## 2023-11-16 DIAGNOSIS — E0590 Thyrotoxicosis, unspecified without thyrotoxic crisis or storm: Secondary | ICD-10-CM | POA: Diagnosis not present

## 2023-11-16 DIAGNOSIS — R011 Cardiac murmur, unspecified: Secondary | ICD-10-CM | POA: Diagnosis not present

## 2023-11-16 DIAGNOSIS — M816 Localized osteoporosis [Lequesne]: Secondary | ICD-10-CM | POA: Diagnosis not present

## 2023-11-16 DIAGNOSIS — E7800 Pure hypercholesterolemia, unspecified: Secondary | ICD-10-CM | POA: Diagnosis not present

## 2023-11-16 DIAGNOSIS — Z0000 Encounter for general adult medical examination without abnormal findings: Secondary | ICD-10-CM | POA: Diagnosis not present

## 2024-01-04 DIAGNOSIS — E0590 Thyrotoxicosis, unspecified without thyrotoxic crisis or storm: Secondary | ICD-10-CM | POA: Diagnosis not present

## 2024-01-10 DIAGNOSIS — H43823 Vitreomacular adhesion, bilateral: Secondary | ICD-10-CM | POA: Diagnosis not present

## 2024-01-10 DIAGNOSIS — H2513 Age-related nuclear cataract, bilateral: Secondary | ICD-10-CM | POA: Diagnosis not present

## 2024-01-10 DIAGNOSIS — H35033 Hypertensive retinopathy, bilateral: Secondary | ICD-10-CM | POA: Diagnosis not present

## 2024-01-10 DIAGNOSIS — H353123 Nonexudative age-related macular degeneration, left eye, advanced atrophic without subfoveal involvement: Secondary | ICD-10-CM | POA: Diagnosis not present

## 2024-01-10 DIAGNOSIS — H353112 Nonexudative age-related macular degeneration, right eye, intermediate dry stage: Secondary | ICD-10-CM | POA: Diagnosis not present

## 2024-01-18 DIAGNOSIS — H5203 Hypermetropia, bilateral: Secondary | ICD-10-CM | POA: Diagnosis not present

## 2024-01-18 DIAGNOSIS — H52203 Unspecified astigmatism, bilateral: Secondary | ICD-10-CM | POA: Diagnosis not present

## 2024-01-18 DIAGNOSIS — H25013 Cortical age-related cataract, bilateral: Secondary | ICD-10-CM | POA: Diagnosis not present

## 2024-01-18 DIAGNOSIS — H353123 Nonexudative age-related macular degeneration, left eye, advanced atrophic without subfoveal involvement: Secondary | ICD-10-CM | POA: Diagnosis not present

## 2024-01-18 DIAGNOSIS — H04123 Dry eye syndrome of bilateral lacrimal glands: Secondary | ICD-10-CM | POA: Diagnosis not present

## 2024-01-18 DIAGNOSIS — H2513 Age-related nuclear cataract, bilateral: Secondary | ICD-10-CM | POA: Diagnosis not present

## 2024-01-18 DIAGNOSIS — H18593 Other hereditary corneal dystrophies, bilateral: Secondary | ICD-10-CM | POA: Diagnosis not present

## 2024-01-24 DIAGNOSIS — R011 Cardiac murmur, unspecified: Secondary | ICD-10-CM | POA: Diagnosis not present

## 2024-03-06 DIAGNOSIS — H353123 Nonexudative age-related macular degeneration, left eye, advanced atrophic without subfoveal involvement: Secondary | ICD-10-CM | POA: Diagnosis not present
